# Patient Record
Sex: Female | Born: 1995 | Race: Black or African American | Hispanic: No | Marital: Single | Smoking: Current some day smoker
Health system: Southern US, Community
[De-identification: ages and names within clinical notes are randomized; demographics above are authoritative.]

## PROBLEM LIST (undated history)

## (undated) ENCOUNTER — Ambulatory Visit (HOSPITAL_COMMUNITY): Admission: EM | Payer: Self-pay | Source: Home / Self Care

## (undated) DIAGNOSIS — J302 Other seasonal allergic rhinitis: Secondary | ICD-10-CM

## (undated) DIAGNOSIS — J45909 Unspecified asthma, uncomplicated: Secondary | ICD-10-CM

---
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
## 2017-01-23 ENCOUNTER — Encounter (HOSPITAL_COMMUNITY): Payer: Self-pay | Admitting: Emergency Medicine

## 2017-01-23 ENCOUNTER — Other Ambulatory Visit: Payer: Self-pay

## 2017-01-23 ENCOUNTER — Emergency Department (HOSPITAL_COMMUNITY): Payer: Self-pay

## 2017-01-23 ENCOUNTER — Emergency Department (HOSPITAL_COMMUNITY)
Admission: EM | Admit: 2017-01-23 | Discharge: 2017-01-23 | Disposition: A | Discharge status: Home / Self Care | Payer: Self-pay | Attending: Emergency Medicine | Admitting: Emergency Medicine

## 2017-01-23 DIAGNOSIS — Z91013 Allergy to seafood: Secondary | ICD-10-CM | POA: Insufficient documentation

## 2017-01-23 DIAGNOSIS — Z720 Tobacco use: Secondary | ICD-10-CM

## 2017-01-23 DIAGNOSIS — Z79899 Other long term (current) drug therapy: Secondary | ICD-10-CM | POA: Insufficient documentation

## 2017-01-23 DIAGNOSIS — R05 Cough: Secondary | ICD-10-CM | POA: Insufficient documentation

## 2017-01-23 DIAGNOSIS — R0981 Nasal congestion: Secondary | ICD-10-CM | POA: Insufficient documentation

## 2017-01-23 DIAGNOSIS — R110 Nausea: Secondary | ICD-10-CM | POA: Insufficient documentation

## 2017-01-23 DIAGNOSIS — J069 Acute upper respiratory infection, unspecified: Secondary | ICD-10-CM | POA: Insufficient documentation

## 2017-01-23 DIAGNOSIS — R059 Cough, unspecified: Secondary | ICD-10-CM

## 2017-01-23 DIAGNOSIS — J45909 Unspecified asthma, uncomplicated: Secondary | ICD-10-CM | POA: Insufficient documentation

## 2017-01-23 DIAGNOSIS — F17200 Nicotine dependence, unspecified, uncomplicated: Secondary | ICD-10-CM | POA: Insufficient documentation

## 2017-01-23 DIAGNOSIS — R197 Diarrhea, unspecified: Secondary | ICD-10-CM | POA: Insufficient documentation

## 2017-01-23 MED ORDER — ALBUTEROL SULFATE HFA 108 (90 BASE) MCG/ACT IN AERS: 2.0000 | INHALATION_SPRAY | RESPIRATORY_TRACT | 0 refills | Status: DC | PRN

## 2017-01-23 MED ORDER — BENZONATATE 200 MG PO CAPS: 200.0000 mg | ORAL_CAPSULE | Freq: Three times a day (TID) | ORAL | 0 refills | Status: DC | PRN

## 2017-01-23 MED ORDER — ALBUTEROL SULFATE HFA 108 (90 BASE) MCG/ACT IN AERS
2.0000 | INHALATION_SPRAY | Freq: Once | RESPIRATORY_TRACT | Status: AC
  Administered 2017-01-23: 2 via RESPIRATORY_TRACT
  Filled 2017-01-23: qty 6.7

## 2017-01-23 NOTE — ED Provider Notes (Signed)
[facility]ENCY [facility] Provider Note   CSN: [id number] Arrival date & time: 01/23/17  1129     History   Chief Complaint Chief Complaint  Patient presents with  . Cough  . Nasal Congestion  . Diarrhea    HPI Paige Young is a 21 y.o. female with a PMHx of depression, asthma, and seasonal allergies, who presents to the ED with complaints of gradually improving cough times 2 weeks.  Patient states that she has had a cough with yellowish sputum production, has tried NyQuil with no relief, states that smoking cigarettes has worsened her cough.  She states that overall, over the last 2 weeks she has been gradually improving, however she keeps getting sent home from work because of her coughing so she came in for evaluation.  She has had associated rhinorrhea which has improved, intermittent nausea, and looser than normal stools approximately 2 times a day which are nonbloody and not watery.  She does admit to being a cigarette smoker.  She denies any sore throat, ear pain or drainage, wheezing, fevers, chills, CP, SOB, abd pain, vomiting, constipation, melena, hematochezia, hematuria, dysuria, myalgias, arthralgias, numbness, tingling, focal weakness, or any other complaints at this time. Denies recent travel, sick contacts, suspicious food intake, recent abx, frequent EtOH use, or NSAID use.    The history is provided by the patient and medical records. No language interpreter was used.  Cough  This is a new problem. The current episode started more than 1 week ago. The problem occurs constantly. The problem has been gradually improving. The cough is productive of sputum. There has been no fever. Associated symptoms include rhinorrhea. Pertinent negatives include no chest pain, no chills, no ear pain, no sore throat, no myalgias, no shortness of breath and no wheezing. She has tried cough syrup for the symptoms. The treatment provided no relief. She is a smoker.  Her past medical history is significant for asthma.  Diarrhea   Associated symptoms include cough. Pertinent negatives include no abdominal pain, no vomiting, no chills, no arthralgias and no myalgias.    Past Medical History:  Diagnosis Date  . Asthma   . Seasonal allergies     Patient Active Problem List   Diagnosis Date Noted  . Major depression 06/01/2013    History reviewed. No pertinent surgical history.  OB History    No data available       Home Medications    Prior to Admission medications   Medication Sig Start Date End Date Taking? Authorizing Provider  citalopram (CELEXA) 10 MG tablet Take 1 tablet (10 mg total) by mouth daily. 06/01/13   Susy FrizzleSheldon, Charles, MD    Family History No family history on file.  Social History Social History   Tobacco Use  . Smoking status: Current Every Day Smoker  . Smokeless tobacco: Current User  Substance Use Topics  . Alcohol use: Yes  . Drug use: Yes    Types: Marijuana     Allergies   Shellfish allergy   Review of Systems Review of Systems  Constitutional: Negative for chills and fever.  HENT: Positive for rhinorrhea. Negative for ear discharge, ear pain and sore throat.   Respiratory: Positive for cough. Negative for shortness of breath and wheezing.   Cardiovascular: Negative for chest pain.  Gastrointestinal: Positive for diarrhea and nausea. Negative for abdominal pain, blood in stool, constipation and vomiting.  Genitourinary: Negative for dysuria and hematuria.  Musculoskeletal: Negative for arthralgias and myalgias.  Skin: Negative for color change.  Allergic/Immunologic: Negative for immunocompromised state.  Neurological: Negative for weakness and numbness.  Psychiatric/Behavioral: Negative for confusion.   All other systems reviewed and are negative for acute change except as noted in the HPI.    Physical Exam Updated Vital Signs BP 119/67 (BP Location: Right Arm)   Pulse 87   Temp 98.2 F  (36.8 C) (Oral)   Resp 16   Ht 5\' 1"  (1.549 m)   Wt 46.3 kg (102 lb)   LMP 01/16/2017   SpO2 100%   BMI 19.27 kg/m   Physical Exam  Constitutional: She is oriented to person, place, and time. Vital signs are normal. She appears well-developed and well-nourished.  Non-toxic appearance. No distress.  Afebrile, nontoxic, NAD  HENT:  Head: Normocephalic and atraumatic.  Nose: Mucosal edema and rhinorrhea present.  Mouth/Throat: Uvula is midline, oropharynx is clear and moist and mucous membranes are normal. No trismus in the jaw. No uvula swelling. Tonsils are 0 on the right. Tonsils are 0 on the left. No tonsillar exudate.  Nose mildly congested with clear rhinorrhea. Oropharynx clear and moist, without uvular swelling or deviation, no trismus or drooling, no tonsillar swelling or erythema, no exudates.    Eyes: Conjunctivae and EOM are normal. Right eye exhibits no discharge. Left eye exhibits no discharge.  Neck: Normal range of motion. Neck supple.  Cardiovascular: Normal rate, regular rhythm, normal heart sounds and intact distal pulses. Exam reveals no gallop and no friction rub.  No murmur heard. Pulmonary/Chest: Effort normal. No respiratory distress. She has decreased breath sounds in the right lower field and the left lower field. She has no wheezes. She has no rhonchi. She has no rales.  Slightly diminished lung sounds in b/l lower fields, otherwise CTAB in all other lung fields, no w/r/r, no hypoxia or increased WOB, speaking in full sentences, SpO2 100% on RA   Abdominal: Soft. Normal appearance and bowel sounds are normal. She exhibits no distension. There is no tenderness. There is no rigidity, no rebound, no guarding, no CVA tenderness, no tenderness at McBurney's point and negative Murphy's sign.  Soft, NTND, +BS throughout, no r/g/r, neg murphy's, neg mcburney's, no CVA TTP   Musculoskeletal: Normal range of motion.  Neurological: She is alert and oriented to person, place,  and time. She has normal strength. No sensory deficit.  Skin: Skin is warm, dry and intact. No rash noted.  Psychiatric: She has a normal mood and affect.  Nursing note and vitals reviewed.    ED Treatments / Results  Labs (all labs ordered are listed, but only abnormal results are displayed) Labs Reviewed - No data to display  EKG  EKG Interpretation None       Radiology Dg Chest 2 View  Result Date: 01/23/2017 CLINICAL DATA:  Productive cough and nausea for 2 weeks. EXAM: CHEST  2 VIEW COMPARISON:  None. FINDINGS: Normal heart size and mediastinal contours. No acute infiltrate or edema. No effusion or pneumothorax. No acute osseous findings. IMPRESSION: Negative chest. Electronically Signed   By: Marnee Spring M.D.   On: 01/23/2017 16:44    Procedures Procedures (including critical care time)  Medications Ordered in ED Medications  albuterol (PROVENTIL HFA;VENTOLIN HFA) 108 (90 Base) MCG/ACT inhaler 2 puff (2 puffs Inhalation Given 01/23/17 1553)     Initial Impression / Assessment and Plan / ED Course  I have reviewed the triage vital signs and the nursing notes.  Pertinent labs & imaging results that  were available during my care of the patient were reviewed by me and considered in my medical decision making (see chart for details).     21 y.o. female here with gradually improving cough x2 wks, but keeps getting sent home because of coughing so she came here. Has had some intermittent nausea, and mild rhinorrhea. Also reports looser than normal stools 2x/day however no watery diarrhea or abdominal pain. On exam, mildly diminished lung sounds in lower fields bilaterally, no wheezing/rhonchi/rales, no hypoxia. No abdominal tenderness. Will get CXR to evaluate for PNA, if no PNA then this is likely a viral illness with persistent post-viral cough. Will give inhaler here and reassess shortly. Doubt need for labs, diarrhea likely just due to sputum production from  sputum/nasal secretions being swallowed.   4:53 PM CXR negative. Pt feeling better and lung sounds improved after inhaler. Symptoms likely from viral URI/lower respiratory infection, and since she's overall improving and CXR is negative for PNA, doubt need for abx at this time. Will send home with inhaler and tessalon perles, advised use of OTC remedies for symptomatic relief as well, smoking cessation strongly advised; advised OTC remedies and BRAT diet to help with diarrhea/loose stools. Discussed f/up with PCP in 1wk for recheck of symptoms. I explained the diagnosis and have given explicit precautions to return to the ER including for any other new or worsening symptoms. The patient understands and accepts the medical plan as it's been dictated and I have answered their questions. Discharge instructions concerning home care and prescriptions have been given. The patient is STABLE and is discharged to home in good condition.    Final Clinical Impressions(s) / ED Diagnoses   Final diagnoses:  Cough  Upper respiratory tract infection, unspecified type  Diarrhea, unspecified type  Tobacco user    ED Discharge Orders        Ordered    albuterol (PROVENTIL HFA;VENTOLIN HFA) 108 (90 Base) MCG/ACT inhaler  Every 4 hours PRN     01/23/17 1649    benzonatate (TESSALON) 200 MG capsule  3 times daily PRN     01/23/17 [address]1649       Sherell Christoffel, Howard LakeMercedes, [place] JerseyPA-C 01/23/17 1653    Cathren LaineSteinl, Kevin, MD 01/24/17 [phone]

## 2017-01-23 NOTE — Discharge Instructions (Signed)
Continue to stay well-hydrated. Gargle warm salt water and spit it out and Use chloraseptic spray as needed for sore throat. Continue to alternate between Tylenol and Ibuprofen for pain or fever. Use Mucinex for cough suppression/expectoration of mucus. Use netipot and flonase to help with nasal congestion. May consider over-the-counter Benadryl or other antihistamine to decrease secretions and for help with your symptoms. Use inhaler as directed, as needed for cough/chest congestion/wheezing/shortness of breath. Use Tesslon pearls for cough suppression. STOP SMOKING! Follow the BRAT diet (bananas-rice-applesauce-toast) as outlined below to help with diarrhea; may consider over the counter remedies such as pepto bismol or imodium as needed for diarrhea. Follow up with your primary care doctor in 5-7 days for recheck of ongoing symptoms. Return to emergency department for emergent changing or worsening of symptoms.

## 2017-01-23 NOTE — ED Triage Notes (Signed)
Pt. Stated, I've had a cough with stuff in there when I cough and also have had diarrhea. This started 2 weeks ago.

## 2017-01-23 NOTE — ED Notes (Signed)
Patient returned from X-ray 

## 2017-01-23 NOTE — ED Notes (Signed)
Patient transported to X-ray 

## 2021-01-28 ENCOUNTER — Ambulatory Visit: Payer: Self-pay | Admitting: *Deleted

## 2021-01-28 DIAGNOSIS — Z3400 Encounter for supervision of normal first pregnancy, unspecified trimester: Secondary | ICD-10-CM

## 2021-01-28 MED ORDER — VITAFOL-NANO 18-0.6-0.4 MG PO TABS: 1.0000 | ORAL_TABLET | Freq: Every day | ORAL | 11 refills | Status: AC

## 2021-01-28 NOTE — Progress Notes (Signed)
New OB Intake  I connected with  Paige Young on 01/28/21 at  2:00 PM EST by telephone Video Visit and verified that I am speaking with the correct person using two identifiers. Nurse is located at [facility] and pt is located at home.  I discussed the limitations, risks, security and privacy concerns of performing an evaluation and management service by telephone and the availability of in person appointments. I also discussed with the patient that there may be a patient responsible charge related to this service. The patient expressed understanding and agreed to proceed.  I explained I am completing New OB Intake today. We discussed her EDD of 07/11/21 that is based on [place] at [facility] on 01/06/21 @ 13.3 wks . Pt is G1/P0. I reviewed her allergies, medications, Medical/Surgical/OB history, and appropriate screenings. I informed her of [facility] services. Based on history, this is a/an  pregnancy uncomplicated .   Patient Active Problem List   Diagnosis Date Noted   Major depression 06/01/2013    Concerns addressed today  Delivery Plans:  Plans to deliver at [facility].   MyChart/Babyscripts MyChart access verified. I explained pt will have some visits in office and some virtually. Babyscripts instructions given and order placed. Patient verifies receipt of registration text/e-mail. Account successfully created and app downloaded.  Blood Pressure Cuff  Patient is self-pay; explained patient will be given BP cuff at first prenatal appt. Explained after first prenatal appt pt will check weekly and document in Babyscripts.  Weight scale: Patient    have weight scale. Weight scale ordered   Anatomy US Explained first scheduled [place] will be around 19 weeks. Anatomy US scheduled for 19 wks at M[facility]. Pt notified to arrive at [facility].  Labs Discussed Avelina Laine genetic screening with patient. Would like both Panorama and Horizon drawn at new OB visit. Routine prenatal labs needed.  Covid  Vaccine Patient has not covid vaccine.   Social Determinants of Health Food Insecurity: Patient denies food insecurity. WIC Referral: Patient is interested in referral to [facility].  Transportation: Patient denies transportation needs. Childcare: Discussed no children allowed at ultrasound appointments. Offered childcare services; patient declines childcare services at this time.  First visit review I reviewed new OB appt with pt. I explained she will have a pelvic exam, ob bloodwork with genetic screening, and PAP smear. Explained pt will be seen by Charlie Pitter at first visit; encounter routed to appropriate provider. Explained that patient will be seen by pregnancy navigator following visit with provider. [facility] information placed in AVS.   Harrel Lemon, RN 01/28/2021  2:17 PM

## 2021-01-29 ENCOUNTER — Telehealth: Payer: Self-pay | Admitting: *Deleted

## 2021-01-29 NOTE — Telephone Encounter (Signed)
TC to notify pt that RX PNV not covered by [facility] Unable to sent in PrePlus at this time due to backorder at pharmacies. Advised OTC PNV and to read ingredients to insure that shellfish allergen is not present.

## 2021-02-04 ENCOUNTER — Other Ambulatory Visit: Payer: Self-pay

## 2021-02-04 DIAGNOSIS — O0990 Supervision of high risk pregnancy, unspecified, unspecified trimester: Secondary | ICD-10-CM | POA: Insufficient documentation

## 2021-02-04 DIAGNOSIS — Z3490 Encounter for supervision of normal pregnancy, unspecified, unspecified trimester: Secondary | ICD-10-CM | POA: Insufficient documentation

## 2021-02-05 ENCOUNTER — Other Ambulatory Visit: Payer: Self-pay

## 2021-02-05 ENCOUNTER — Other Ambulatory Visit (HOSPITAL_COMMUNITY)
Admission: RE | Admit: 2021-02-05 | Discharge: 2021-02-05 | Disposition: A | Discharge status: Home / Self Care | Payer: Medicaid Other | Source: Ambulatory Visit | Attending: Women's Health | Admitting: Women's Health

## 2021-02-05 ENCOUNTER — Ambulatory Visit (INDEPENDENT_AMBULATORY_CARE_PROVIDER_SITE_OTHER): Payer: Self-pay | Admitting: Women's Health

## 2021-02-05 VITALS — BP 102/54 | HR 80 | Wt 120.0 lb

## 2021-02-05 DIAGNOSIS — Z3400 Encounter for supervision of normal first pregnancy, unspecified trimester: Secondary | ICD-10-CM

## 2021-02-05 DIAGNOSIS — Z3A17 17 weeks gestation of pregnancy: Secondary | ICD-10-CM

## 2021-02-05 DIAGNOSIS — O99342 Other mental disorders complicating pregnancy, second trimester: Secondary | ICD-10-CM

## 2021-02-05 DIAGNOSIS — F329 Major depressive disorder, single episode, unspecified: Secondary | ICD-10-CM

## 2021-02-05 DIAGNOSIS — Z8709 Personal history of other diseases of the respiratory system: Secondary | ICD-10-CM

## 2021-02-05 MED ORDER — ALBUTEROL SULFATE HFA 108 (90 BASE) MCG/ACT IN AERS
2.0000 | INHALATION_SPRAY | Freq: Four times a day (QID) | RESPIRATORY_TRACT | 2 refills | Status: AC | PRN
Start: 2021-02-05 — End: ?

## 2021-02-05 NOTE — Progress Notes (Signed)
History:   Paige Young is a 25 y.o. G1P0 at [redacted]w[redacted]d by 13w [place] at [facility] being seen today for her first obstetrical visit.  Her obstetrical history is significant for  history of asthma . Patient does intend to breast feed. Pregnancy history fully reviewed.  Allergies: Shellfish Current Medications: none PMH: asthma, no use of inhaler in years, hx of hospitalization for asthma as infant. No HTN, DM. PSH: none OB Hx: none Social Hx: pt does not smoke, drink, or use drugs. Family Hx: none Pt declines flu vaccine.  Patient reports no complaints.      HISTORY: OB History  Gravida Para Term Preterm AB Living  1 0 0 0 0 0  SAB IAB Ectopic Multiple Live Births  0 0 0 0 0    # Outcome Date GA Lbr Len/2nd Weight Sex Delivery Anes PTL Lv  1 Current             Last pap smear was done earlier this year and was normal, records requested.  Past Medical History:  Diagnosis Date   Asthma    Seasonal allergies    No past surgical history on file. Family History  Problem Relation Age of Onset   Asthma Mother    Hypertension Father    Social History   Tobacco Use   Smoking status: Never   Smokeless tobacco: Never  Vaping Use   Vaping Use: Never used  Substance Use Topics   Alcohol use: Not Currently   Drug use: Not Currently    Types: Marijuana   Allergies  Allergen Reactions   Shellfish Allergy Other (See Comments)   Current Outpatient Medications on File Prior to Visit  Medication Sig Dispense Refill   benzonatate (TESSALON) 200 MG capsule Take 1 capsule (200 mg total) 3 (three) times daily as needed by mouth for cough. 20 capsule 0   citalopram (CELEXA) 10 MG tablet Take 1 tablet (10 mg total) by mouth daily. 14 tablet 0   DM-Doxylamine-Acetaminophen (NYQUIL COLD & FLU PO) Take 1 tablet daily as needed by mouth (for common cold).     Prenatal-Fe Fum-Methf-FA w/o A (VITAFOL-NANO) 18-0.6-0.4 MG TABS Take 1 tablet by mouth daily. 30 tablet 11   No  current facility-administered medications on file prior to visit.    Review of Systems Pertinent items noted in HPI and remainder of comprehensive ROS otherwise negative.  Physical Exam:   Vitals:   02/05/21 1105  BP: (!) 102/54  Pulse: 80  Weight: 120 lb (54.4 kg)   Fetal Heart Rate (bpm): 160  Uterine size: 17w    General: well-developed, well-nourished female in no acute distress  Breasts:  pt declines  Skin: normal coloration and turgor, no rashes  Neurologic: oriented, normal, negative, normal mood  Extremities: normal strength, tone, and muscle mass, ROM of all joints is normal  HEENT PERRLA, extraocular movement intact and sclera clear, anicteric  Neck supple and no masses  Cardiovascular: regular rate and rhythm  Respiratory:  no respiratory distress, normal breath sounds  Abdomen: soft, non-tender; bowel sounds normal; no masses,  no organomegaly  Pelvic: deferred    Assessment:    Pregnancy: G1P0 Patient Active Problem List   Diagnosis Date Noted   History of asthma 02/05/2021   Supervision of normal pregnancy 02/04/2021   Major depression 06/01/2013     Plan:    1. Supervision of normal first pregnancy, antepartum - Cervicovaginal ancillary only( [place]) - Cytology - PAP( [facility]) - Culture, OB Urine - Obstetric Panel, Including HIV - Hepatitis C Antibody - Genetic Screening - AFP, Serum, Open Spina Bifida - Flu Vaccine QUAD 36+ mos IM (Fluarix, Quad PF) - [place] MFM OB COMP + 14 WK; Future  2. Major depressive disorder, remission status unspecified, unspecified whether recurrent - Ambulatory referral to Integrated Behavioral Health  PHQ9 SCORE ONLY 01/28/2021  PHQ-9 Total Score 12   GAD 7 : Generalized Anxiety Score 01/28/2021  Nervous, Anxious, on Edge 3  Control/stop worrying 1  Worry too much - different things 3  Trouble relaxing 1  Restless 0  Easily annoyed or irritable 2  Afraid - awful might happen 1  Total GAD 7 Score 11    3. [redacted] weeks gestation of pregnancy  4. History of asthma - no exacerbation in years - albuterol (VENTOLIN HFA) 108 (90 Base) MCG/ACT inhaler; Inhale 2 puffs into the lungs every 6 (six) hours as needed for wheezing or shortness of breath.  Dispense: 8 g; Refill: 2  Initial labs drawn. Continue prenatal vitamins. Problem list reviewed and updated. Genetic Screening discussed, NIPS: ordered. Ultrasound discussed; fetal anatomic survey: ordered. Anticipatory guidance about prenatal visits given including labs, ultrasounds, and testing.  Encouraged to complete MyChart Registration for her ability to review results, send requests, and have questions addressed.  The nature of [place] [facility] Practice with multiple MDs and Advanced Practice Providers was explained to patient; also emphasized that residents, students are part of our team. Routine obstetric precautions reviewed. Encouraged to seek out care at office or emergency room [facility] preferred) for urgent and/or emergent concerns. Return in about 4 weeks (around 03/05/2021) for in-person LOB/APP OK.     Marylen Ponto, NP  11:32 AM 02/05/2021

## 2021-02-05 NOTE — Patient Instructions (Addendum)
Maternity Assessment Unit (MAU)  The Maternity Assessment Unit (MAU) is located at the [facility] at [facility]. The address is: [address] [place], [place], [place] [address]. Please see map below for additional directions.    The Maternity Assessment Unit is designed to help you during your pregnancy, and for up to 6 weeks after delivery, with any pregnancy- or postpartum-related emergencies, if you think you are in labor, or if your water has broken. For example, if you experience nausea and vomiting, vaginal bleeding, severe abdominal or pelvic pain, elevated blood pressure or other problems related to your pregnancy or postpartum time, please come to the Maternity Assessment Unit for assistance.       Second Trimester of Pregnancy The second trimester of pregnancy is from week 13 through week 27. This is months 4 through 6 of pregnancy. The second trimester is often a time when you feel your best. Your body has adjusted to being pregnant, and you begin to feel better physically. During the second trimester: Morning sickness has lessened or stopped completely. You may have more energy. You may have an increase in appetite. The second trimester is also a time when the unborn baby (fetus) is growing rapidly. At the end of the sixth month, the fetus may be up to 12 inches long and weigh about 1 pounds. You will likely begin to feel the baby move (quickening) between 16 and 20 weeks of pregnancy. Body changes during your second trimester Your body continues to go through many changes during your second trimester. The changes vary and generally return to normal after the baby is born. Physical changes Your weight will continue to increase. You will notice your lower abdomen bulging out. You may begin to get stretch marks on your hips, abdomen, and breasts. Your breasts will continue to grow and to become tender. Dark spots or blotches (chloasma or  mask of pregnancy) may develop on your face. A dark line from your belly button to the pubic area (linea nigra) may appear. You may have changes in your hair. These can include thickening of your hair, rapid growth, and changes in texture. Some people also have hair loss during or after pregnancy, or hair that feels dry or thin. Health changes You may develop headaches. You may have heartburn. You may develop constipation. You may develop hemorrhoids or swollen, bulging veins (varicose veins). Your gums may bleed and may be sensitive to brushing and flossing. You may urinate more often because the fetus is pressing on your bladder. You may have back pain. This is caused by: Weight gain. Pregnancy hormones that are relaxing the joints in your pelvis. A shift in weight and the muscles that support your balance. Follow these instructions at home: Medicines Follow your health care provider's instructions regarding medicine use. Specific medicines may be either safe or unsafe to take during pregnancy. Do not take any medicines unless approved by your health care provider. Take a prenatal vitamin that contains at least 600 micrograms (mcg) of folic acid. Eating and drinking Eat a healthy diet that includes fresh fruits and vegetables, whole grains, good sources of protein such as meat, eggs, or tofu, and low-fat dairy products. Avoid raw meat and unpasteurized juice, milk, and cheese. These carry germs that can harm you and your baby. You may need to take these actions to prevent or treat constipation: Drink enough fluid to keep your urine pale yellow. Eat foods that are high in fiber, such  as beans, whole grains, and fresh fruits and vegetables. Limit foods that are high in fat and processed sugars, such as fried or sweet foods. Activity Exercise only as directed by your health care provider. Most people can continue their usual exercise routine during pregnancy. Try to exercise for 30 minutes  at least 5 days a week. Stop exercising if you develop contractions in your uterus. Stop exercising if you develop pain or cramping in the lower abdomen or lower back. Avoid exercising if it is very hot or humid or if you are at a high altitude. Avoid heavy lifting. If you choose to, you may have sex unless your health care provider tells you not to. Relieving pain and discomfort Wear a supportive bra to prevent discomfort from breast tenderness. Take warm sitz baths to soothe any pain or discomfort caused by hemorrhoids. Use hemorrhoid cream if your health care provider approves. Rest with your legs raised (elevated) if you have leg cramps or low back pain. If you develop varicose veins: Wear support hose as told by your health care provider. Elevate your feet for 15 minutes, 3-4 times a day. Limit salt in your diet. Safety Wear your seat belt at all times when driving or riding in a car. Talk with your health care provider if someone is verbally or physically abusive to you. Lifestyle Do not use hot tubs, steam rooms, or saunas. Do not douche. Do not use tampons or scented sanitary pads. Avoid cat litter boxes and soil used by cats. These carry germs that can cause birth defects in the baby and possibly loss of the fetus by miscarriage or stillbirth. Do not use herbal remedies, alcohol, illegal drugs, or medicines that are not approved by your health care provider. Chemicals in these products can harm your baby. Do not use any products that contain nicotine or tobacco, such as cigarettes, e-cigarettes, and chewing tobacco. If you need help quitting, ask your health care provider. General instructions During a routine prenatal visit, your health care provider will do a physical exam and other tests. He or she will also discuss your overall health. Keep all follow-up visits. This is important. Ask your health care provider for a referral to a local prenatal education class. Ask for help if  you have counseling or nutritional needs during pregnancy. Your health care provider can offer advice or refer you to specialists for help with various needs. Where to find more information American Pregnancy Association: americanpregnancy.org [employer]: https://www.todd-brady.net/ Office on [employer] Health: MightyReward.co.nz Contact a health care provider if you have: A headache that does not go away when you take medicine. Vision changes or you see spots in front of your eyes. Mild pelvic cramps, pelvic pressure, or nagging pain in the abdominal area. Persistent nausea, vomiting, or diarrhea. A bad-smelling vaginal discharge or foul-smelling urine. Pain when you urinate. Sudden or extreme swelling of your face, hands, ankles, feet, or legs. A fever. Get help right away if you: Have fluid leaking from your vagina. Have spotting or bleeding from your vagina. Have severe abdominal cramping or pain. Have difficulty breathing. Have chest pain. Have fainting spells. Have not felt your baby move for the time period told by your health care provider. Have new or increased pain, swelling, or redness in an arm or leg. Summary The second trimester of pregnancy is from week 13 through week 27 (months 4 through 6). Do not use herbal remedies, alcohol, illegal drugs, or medicines that are not  approved by your health care provider. Chemicals in these products can harm your baby. Exercise only as directed by your health care provider. Most people can continue their usual exercise routine during pregnancy. Keep all follow-up visits. This is important. This information is not intended to replace advice given to you by your health care provider. Make sure you discuss any questions you have with your health care provider. Document Revised: 08/02/2019 Document Reviewed: 06/08/2019 Elsevier Patient Education  2022 Elsevier Inc.       Round  Ligament Pain The round ligaments are a pair of cord-like tissues that help support the uterus. They can become a source of pain during pregnancy as the ligaments soften and stretch as the baby grows. The pain usually begins in the second trimester (13-28 weeks) of pregnancy, and should only last for a few seconds when it occurs. However, the pain can come and go until the baby is delivered. The pain does not cause harm to the baby. Round ligament pain is usually a short, sharp, and pinching pain, but it can also be a dull, lingering, and aching pain. The pain is felt in the lower side of the abdomen or in the groin. It usually starts deep in the groin and moves up to the outside of the hip area. The pain may happen when you: Suddenly change position, such as quickly going from a sitting to standing position. Do physical activity. Cough or sneeze. Follow these instructions at home: Managing pain  When the pain starts, relax. Then, try any of these methods to help with the pain: Sit down. Flex your knees up to your abdomen. Lie on your side with one pillow under your abdomen and another pillow between your legs. Sit in a warm bath for 15-20 minutes or until the pain goes away. General instructions Watch your condition for any changes. Move slowly when you sit down or stand up. Stop or reduce your physical activities if they cause pain. Avoid long walks if they cause pain. Take over-the-counter and prescription medicines only as told by your health care provider. Keep all follow-up visits. This is important. Contact a health care provider if: Your pain does not go away with treatment. You feel pain in your back that you did not have before. Your medicine is not helping. You have a fever or chills. You have nausea or vomiting. You have diarrhea. You have pain when you urinate. Get help right away if: You have pain that is a rhythmic, cramping pain similar to labor pains. Labor pains are  usually 2 minutes apart, last for about 1 minute, and involve a bearing down feeling or pressure in your pelvis. You have vaginal bleeding. These symptoms may represent a serious problem that is an emergency. Do not wait to see if the symptoms will go away. Get medical help right away. Call your local emergency services (911 in the [place] Do not drive yourself to the hospital. Summary Round ligament pain is felt in the lower abdomen or groin. This pain usually begins in the second trimester (13-28 weeks) and should only last for a few seconds when it occurs. You may notice the pain when you suddenly change position, when you cough or sneeze, or during physical activity. Relaxing, flexing your knees to your abdomen, lying on one side, or taking a warm bath may help to get rid of the pain. Contact your health care provider if the pain does not go away. This information is not intended to replace  advice given to you by your health care provider. Make sure you discuss any questions you have with your health care provider. Document Revised: 05/08/2020 Document Reviewed: 05/08/2020 Elsevier Patient Education  2022 Elsevier Inc.       Preterm Labor The normal length of a pregnancy is 39-41 weeks. Preterm labor is when labor starts before 37 completed weeks of pregnancy. Babies who are born prematurely and survive may not be fully developed and may be at an increased risk for long-term problems such as cerebral palsy, developmental delays, and vision and hearing problems. Babies who are born too early may have problems soon after birth. Premature babies may have problems regulating blood sugar, body temperature, heart rate, and breathing rate. These babies often have trouble with feeding. The risk of having problems is highest for babies who are born before 34 weeks of pregnancy. What are the causes? The exact cause of this condition is not known. What increases the risk? You are more likely to have  preterm labor if you have certain risk factors that relate to your medical history, problems with present and past pregnancies, and lifestyle factors. Medical history You have abnormalities of the uterus, including a short cervix. You have STIs (sexually transmitted infections) or other infections of the urinary tract and the vagina. You have chronic illnesses, such as blood clotting problems, diabetes, or high blood pressure. You are overweight or underweight. Present and past pregnancies You have had preterm labor before. You are pregnant with twins or other multiples. You have been diagnosed with a condition in which the placenta covers your cervix (placenta previa). You waited less than 18 months between giving birth and becoming pregnant again. Your unborn baby has some abnormalities. You have vaginal bleeding during pregnancy. You became pregnant through in vitro fertilization (IVF). Lifestyle and environmental factors You use tobacco products or drink alcohol. You use drugs. You have stress and no social support. You experience domestic violence. You are exposed to certain chemicals or environmental pollutants. Other factors You are younger than age 61 or older than age 72. What are the signs or symptoms? Symptoms of this condition include: Cramps similar to those that can happen during a menstrual period. The cramps may happen with diarrhea. Pain in the abdomen or lower back. Regular contractions that may feel like tightening of the abdomen. A feeling of increased pressure in the pelvis. Increased watery or bloody mucus discharge from the vagina. Water breaking (ruptured amniotic sac). How is this diagnosed? This condition is diagnosed based on: Your medical history and a physical exam. A pelvic exam. An ultrasound. Monitoring your uterus for contractions. Other tests, including: A swab of the cervix to check for a chemical called fetal fibronectin. Urine tests. How is  this treated? Treatment for this condition depends on the length of your pregnancy, your condition, and the health of your baby. Treatment may include: Taking medicines, such as: Hormone medicines. These may be given early in pregnancy to help support the pregnancy. Medicines to stop contractions. Medicines to help mature the baby's lungs. These may be prescribed if the risk of delivery is high. Medicines to help protect your baby from brain and nerve complications such as cerebral palsy. Bed rest. If the labor happens before 34 weeks of pregnancy, you may need to stay in the hospital. Delivery of the baby. Follow these instructions at home:  Do not use any products that contain nicotine or tobacco. These products include cigarettes, chewing tobacco, and vaping devices, such as  e-cigarettes. If you need help quitting, ask your health care provider. Do not drink alcohol. Take over-the-counter and prescription medicines only as told by your health care provider. Rest as told by your health care provider. Return to your normal activities as told by your health care provider. Ask your health care provider what activities are safe for you. Keep all follow-up visits. This is important. How is this prevented? To increase your chance of having a full-term pregnancy: Do not use drugs or take medicines that have not been prescribed to you during your pregnancy. Talk with your health care provider before taking any herbal supplements, even if you have been taking them regularly. Make sure you gain a healthy amount of weight during your pregnancy. Watch for infection. If you think that you might have an infection, get it checked right away. Symptoms of infection may include: Fever. Abnormal vaginal discharge or discharge that smells bad. Pain or burning with urination. Needing to urinate urgently. Frequently urinating or passing small amounts of urine frequently. Blood in your urine or urine that  smells bad or unusual. Where to find more information [place].S. Department of Health and Cytogeneticist on Women's Health: http://hoffman.com/ [employer]nec[employer]: www.acog.org Centers for Disease Control and Prevention, Preterm Birth: FootballExhibition.com.br Contact a health care provider if: You think you are going into preterm labor. You have signs or symptoms of preterm labor. You have symptoms of infection. Get help right away if: You are having regular, painful contractions every 5 minutes or less. Your water breaks. Summary Preterm labor is labor that starts before you reach 37 weeks of pregnancy. Delivering your baby early increases your baby's risk of developing long-term problems. You are more likely to have preterm labor if you have certain risk factors that relate to your medical history, problems with present and past pregnancies, and lifestyle factors. Keep all follow-up visits. This is important. Contact a health care provider if you have signs or symptoms of preterm labor. This information is not intended to replace advice given to you by your health care provider. Make sure you discuss any questions you have with your health care provider. Document Revised: 02/27/2020 Document Reviewed: 02/27/2020 Elsevier Patient Education  2022 [employer].

## 2021-02-06 LAB — CERVICOVAGINAL ANCILLARY ONLY
Chlamydia: NEGATIVE
Comment: NEGATIVE
Comment: NORMAL
Neisseria Gonorrhea: NEGATIVE

## 2021-02-07 ENCOUNTER — Encounter: Payer: Medicaid Other | Admitting: Licensed Clinical Social Worker

## 2021-02-07 ENCOUNTER — Telehealth: Payer: Self-pay | Admitting: Licensed Clinical Social Worker

## 2021-02-07 LAB — OBSTETRIC PANEL, INCLUDING HIV
Antibody Screen: NEGATIVE
Basophils Absolute: 0 10*3/uL (ref 0.0–0.2)
Basos: 0 %
EOS (ABSOLUTE): 0.3 10*3/uL (ref 0.0–0.4)
Eos: 3 %
HIV Screen 4th Generation wRfx: NONREACTIVE
Hematocrit: 31.7 % — ABNORMAL LOW (ref 34.0–46.6)
Hemoglobin: 11 g/dL — ABNORMAL LOW (ref 11.1–15.9)
Hepatitis B Surface Ag: NEGATIVE
Immature Grans (Abs): 0 10*3/uL (ref 0.0–0.1)
Immature Granulocytes: 0 %
Lymphocytes Absolute: 1.6 10*3/uL (ref 0.7–3.1)
Lymphs: 18 %
MCH: 32.1 pg (ref 26.6–33.0)
MCHC: 34.7 g/dL (ref 31.5–35.7)
MCV: 92 fL (ref 79–97)
Monocytes Absolute: 0.8 10*3/uL (ref 0.1–0.9)
Monocytes: 9 %
Neutrophils Absolute: 6 10*3/uL (ref 1.4–7.0)
Neutrophils: 70 %
Platelets: 263 10*3/uL (ref 150–450)
RBC: 3.43 x10E6/uL — ABNORMAL LOW (ref 3.77–5.28)
RDW: 11.8 % (ref 11.7–15.4)
RPR Ser Ql: NONREACTIVE
Rh Factor: POSITIVE
Rubella Antibodies, IGG: 5.3 index (ref >0.99)
WBC: 8.7 10*3/uL (ref 3.4–10.8)

## 2021-02-07 LAB — CULTURE, OB URINE

## 2021-02-07 LAB — AFP, SERUM, OPEN SPINA BIFIDA
AFP MoM: 0.84
AFP Value: 46 ng/mL
Gest. Age on Collection Date: 17.5 weeks
Maternal Age At EDD: 25.2 yr
OSBR Risk 1 IN: 10000
Test Results:: NEGATIVE
Weight: 120 [lb_av]

## 2021-02-07 LAB — URINE CULTURE, OB REFLEX

## 2021-02-07 LAB — HEPATITIS C ANTIBODY: Hep C Virus Ab: 0.1 s/co ratio (ref 0.0–0.9)

## 2021-02-07 NOTE — Telephone Encounter (Signed)
Called pt regarding scheduled mychart visit. Left message requesting callback  

## 2021-02-11 ENCOUNTER — Encounter: Payer: Self-pay | Admitting: Women's Health

## 2021-02-17 ENCOUNTER — Encounter: Payer: Self-pay | Admitting: Women's Health

## 2021-02-18 ENCOUNTER — Ambulatory Visit: Payer: Medicaid Other | Attending: Obstetrics & Gynecology

## 2021-02-18 ENCOUNTER — Other Ambulatory Visit: Payer: Self-pay

## 2021-02-18 ENCOUNTER — Ambulatory Visit: Payer: Medicaid Other | Admitting: *Deleted

## 2021-02-18 ENCOUNTER — Encounter: Payer: Self-pay | Admitting: *Deleted

## 2021-02-18 VITALS — BP 97/58 | HR 79

## 2021-02-18 DIAGNOSIS — Z3689 Encounter for other specified antenatal screening: Secondary | ICD-10-CM

## 2021-02-18 DIAGNOSIS — Z3A19 19 weeks gestation of pregnancy: Secondary | ICD-10-CM

## 2021-02-18 DIAGNOSIS — O99513 Diseases of the respiratory system complicating pregnancy, third trimester: Secondary | ICD-10-CM

## 2021-02-18 DIAGNOSIS — Z3400 Encounter for supervision of normal first pregnancy, unspecified trimester: Secondary | ICD-10-CM

## 2021-02-18 DIAGNOSIS — J45909 Unspecified asthma, uncomplicated: Secondary | ICD-10-CM

## 2021-02-18 DIAGNOSIS — Z363 Encounter for antenatal screening for malformations: Secondary | ICD-10-CM | POA: Insufficient documentation

## 2021-02-19 ENCOUNTER — Other Ambulatory Visit: Payer: Self-pay | Admitting: *Deleted

## 2021-02-19 DIAGNOSIS — Z3689 Encounter for other specified antenatal screening: Secondary | ICD-10-CM

## 2021-02-28 ENCOUNTER — Encounter: Payer: Medicaid Other | Admitting: Licensed Clinical Social Worker

## 2021-02-28 ENCOUNTER — Telehealth: Payer: Self-pay | Admitting: Licensed Clinical Social Worker

## 2021-02-28 NOTE — Telephone Encounter (Signed)
Called pt regarding scheduled mychart visit. Unable to leave voicemail due to error message from pt phone. Sent mychart link via text message. Pt no show visit.

## 2021-03-05 ENCOUNTER — Encounter: Payer: Medicaid Other | Admitting: Nurse Practitioner

## 2021-03-11 ENCOUNTER — Encounter: Payer: Self-pay | Admitting: *Deleted

## 2021-03-13 ENCOUNTER — Ambulatory Visit (HOSPITAL_COMMUNITY)
Admission: RE | Admit: 2021-03-13 | Discharge: 2021-03-13 | Disposition: A | Discharge status: Home / Self Care | Payer: Self-pay | Attending: Psychiatry | Admitting: Psychiatry

## 2021-03-13 ENCOUNTER — Ambulatory Visit (HOSPITAL_COMMUNITY): Admission: RE | Admit: 2021-03-13 | Payer: Self-pay | Source: Home / Self Care | Admitting: Psychiatry
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
## 2021-03-13 NOTE — Discharge Instructions (Signed)
Follow up with PHP program referral has been made.
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
## 2021-03-18 ENCOUNTER — Ambulatory Visit: Payer: Medicaid Other

## 2021-03-18 ENCOUNTER — Other Ambulatory Visit: Payer: Self-pay

## 2021-03-18 ENCOUNTER — Ambulatory Visit (INDEPENDENT_AMBULATORY_CARE_PROVIDER_SITE_OTHER): Payer: Self-pay | Admitting: Obstetrics and Gynecology

## 2021-03-18 ENCOUNTER — Telehealth (HOSPITAL_COMMUNITY): Payer: Self-pay | Admitting: Professional

## 2021-03-18 VITALS — BP 94/54 | HR 86 | Wt 128.6 lb

## 2021-03-18 DIAGNOSIS — Z3402 Encounter for supervision of normal first pregnancy, second trimester: Secondary | ICD-10-CM | POA: Diagnosis not present

## 2021-03-18 DIAGNOSIS — Z8709 Personal history of other diseases of the respiratory system: Secondary | ICD-10-CM

## 2021-03-18 DIAGNOSIS — Z3400 Encounter for supervision of normal first pregnancy, unspecified trimester: Secondary | ICD-10-CM

## 2021-03-18 DIAGNOSIS — Z3A23 23 weeks gestation of pregnancy: Secondary | ICD-10-CM | POA: Diagnosis not present

## 2021-03-18 DIAGNOSIS — F329 Major depressive disorder, single episode, unspecified: Secondary | ICD-10-CM

## 2021-03-18 NOTE — Progress Notes (Signed)
° °  PRENATAL VISIT NOTE  Subjective:  Paige Young is a 26 y.o. G1P0 at [redacted]w[redacted]d being seen today for ongoing prenatal care.  She is currently monitored for the following issues for this low-risk pregnancy and has Major depression; Supervision of normal pregnancy; and History of asthma on their problem list.  Patient reports no complaints.  Contractions: Not present. Vag. Bleeding: None.  Movement: Present. Denies leaking of fluid.   The following portions of the patient's history were reviewed and updated as appropriate: allergies, current medications, past family history, past medical history, past social history, past surgical history and problem list.   Objective:   Vitals:   03/18/21 1345  BP: (!) 94/54  Pulse: 86  Weight: 128 lb 9.6 oz (58.3 kg)    Fetal Status: Fetal Heart Rate (bpm): 155   Movement: Present     General:  Alert, oriented and cooperative. Patient is in no acute distress.  Skin: Skin is warm and dry. No rash noted.   Cardiovascular: Normal heart rate noted  Respiratory: Normal respiratory effort, no problems with respiration noted  Abdomen: Soft, gravid, appropriate for gestational age.  Pain/Pressure: Present     Pelvic: Cervical exam deferred        Extremities: Normal range of motion.  Edema: None  Mental Status: Normal mood and affect. Normal behavior. Normal judgment and thought content.   Assessment and Plan:  Pregnancy: G1P0 at [redacted]w[redacted]d 1. Supervision of normal first pregnancy, antepartum 28w labs next time Growth was 9%ile on anatomy scan - will schedule for this week or next for her interval growth.   2. Major depressive disorder, remission status unspecified, unspecified whether recurrent No meds. Mood is anxious due to the growth on anatomy. Counseling provided. Pt felt reassured.   3. History of asthma Albuterol prn, no exacerbations  Preterm labor symptoms and general obstetric precautions including but not limited to vaginal bleeding,  contractions, leaking of fluid and fetal movement were reviewed in detail with the patient. Please refer to After Visit Summary for other counseling recommendations.   Return in about 4 weeks (around 04/15/2021) for OB VISIT, MD or APP, 2 hr GTT.  No future appointments.   Milas Hock, MD

## 2021-03-20 ENCOUNTER — Ambulatory Visit: Payer: Medicaid Other | Attending: Obstetrics

## 2021-03-20 ENCOUNTER — Other Ambulatory Visit: Payer: Self-pay | Admitting: *Deleted

## 2021-03-20 ENCOUNTER — Other Ambulatory Visit: Payer: Self-pay

## 2021-03-20 ENCOUNTER — Ambulatory Visit: Payer: Medicaid Other | Admitting: *Deleted

## 2021-03-20 ENCOUNTER — Other Ambulatory Visit: Payer: Self-pay | Admitting: Obstetrics

## 2021-03-20 VITALS — BP 106/54 | HR 91

## 2021-03-20 DIAGNOSIS — O365921 Maternal care for other known or suspected poor fetal growth, second trimester, fetus 1: Secondary | ICD-10-CM

## 2021-03-20 DIAGNOSIS — J45909 Unspecified asthma, uncomplicated: Secondary | ICD-10-CM | POA: Diagnosis not present

## 2021-03-20 DIAGNOSIS — Z3402 Encounter for supervision of normal first pregnancy, second trimester: Secondary | ICD-10-CM

## 2021-03-20 DIAGNOSIS — Z3A23 23 weeks gestation of pregnancy: Secondary | ICD-10-CM | POA: Diagnosis not present

## 2021-03-20 DIAGNOSIS — O99512 Diseases of the respiratory system complicating pregnancy, second trimester: Secondary | ICD-10-CM | POA: Diagnosis not present

## 2021-03-20 DIAGNOSIS — Z3689 Encounter for other specified antenatal screening: Secondary | ICD-10-CM

## 2021-03-20 DIAGNOSIS — O365930 Maternal care for other known or suspected poor fetal growth, third trimester, not applicable or unspecified: Secondary | ICD-10-CM

## 2021-03-21 ENCOUNTER — Telehealth (HOSPITAL_COMMUNITY): Payer: Self-pay | Admitting: Professional

## 2021-04-15 ENCOUNTER — Encounter: Payer: Self-pay | Admitting: Obstetrics

## 2021-04-15 ENCOUNTER — Other Ambulatory Visit: Payer: Medicaid Other

## 2021-04-15 ENCOUNTER — Ambulatory Visit (INDEPENDENT_AMBULATORY_CARE_PROVIDER_SITE_OTHER): Payer: Medicaid Other | Admitting: Obstetrics

## 2021-04-15 ENCOUNTER — Other Ambulatory Visit: Payer: Self-pay

## 2021-04-15 VITALS — BP 110/61 | HR 77 | Wt 132.0 lb

## 2021-04-15 DIAGNOSIS — F439 Reaction to severe stress, unspecified: Secondary | ICD-10-CM

## 2021-04-15 DIAGNOSIS — Z3400 Encounter for supervision of normal first pregnancy, unspecified trimester: Secondary | ICD-10-CM

## 2021-04-15 NOTE — Progress Notes (Signed)
Subjective:  Paige Young is a 26 y.o. G1P0 at [redacted]w[redacted]d being seen today for ongoing prenatal care.  She is currently monitored for the following issues for this low-risk pregnancy and has Major depression; Supervision of normal pregnancy; and History of asthma on their problem list.  Patient reports backache.  Contractions: Not present. Vag. Bleeding: None.  Movement: Present. Denies leaking of fluid.   The following portions of the patient's history were reviewed and updated as appropriate: allergies, current medications, past family history, past medical history, past social history, past surgical history and problem list. Problem list updated.  Objective:   Vitals:   04/15/21 0903  BP: 110/61  Pulse: 77  Weight: 132 lb (59.9 kg)    Fetal Status: Fetal Heart Rate (bpm): 165   Movement: Present     General:  Alert, oriented and cooperative. Patient is in no acute distress.  Skin: Skin is warm and dry. No rash noted.   Cardiovascular: Normal heart rate noted  Respiratory: Normal respiratory effort, no problems with respiration noted  Abdomen: Soft, gravid, appropriate for gestational age. Pain/Pressure: Present     Pelvic:  Cervical exam deferred        Extremities: Normal range of motion.  Edema: None  Mental Status: Normal mood and affect. Normal behavior. Normal judgment and thought content.   Urinalysis:      Assessment and Plan:  Pregnancy: G1P0 at [redacted]w[redacted]d  1. Supervision of normal first pregnancy, antepartum  2. Situational stress Rx: - Ambulatory referral to [facility]     Preterm labor symptoms and general obstetric precautions including but not limited to vaginal bleeding, contractions, leaking of fluid and fetal movement were reviewed in detail with the patient. Please refer to After Visit Summary for other counseling recommendations.   Return in about 2 weeks (around 04/29/2021) for ROB, 2 hour OGTT.   Brock Bad, MD  04/15/21

## 2021-04-15 NOTE — Progress Notes (Signed)
Patient presents for ROB. PHQ9: 8, GAD7: 14. Referral for [facility].Patient ate cereal before appointment today, prefers lab draw at next appointment. Defers TDAP to next appointment. No other concerns today. Sharlyne Pacas, RN

## 2021-04-17 ENCOUNTER — Ambulatory Visit: Payer: Medicaid Other | Admitting: *Deleted

## 2021-04-17 ENCOUNTER — Other Ambulatory Visit: Payer: Self-pay | Admitting: Obstetrics

## 2021-04-17 ENCOUNTER — Ambulatory Visit: Payer: Medicaid Other | Attending: Obstetrics and Gynecology | Admitting: Obstetrics and Gynecology

## 2021-04-17 ENCOUNTER — Other Ambulatory Visit: Payer: Self-pay

## 2021-04-17 ENCOUNTER — Other Ambulatory Visit: Payer: Self-pay | Admitting: *Deleted

## 2021-04-17 ENCOUNTER — Ambulatory Visit: Payer: Medicaid Other | Attending: Obstetrics

## 2021-04-17 VITALS — BP 107/50 | HR 70

## 2021-04-17 DIAGNOSIS — O99512 Diseases of the respiratory system complicating pregnancy, second trimester: Secondary | ICD-10-CM | POA: Diagnosis not present

## 2021-04-17 DIAGNOSIS — Z3A27 27 weeks gestation of pregnancy: Secondary | ICD-10-CM

## 2021-04-17 DIAGNOSIS — O365921 Maternal care for other known or suspected poor fetal growth, second trimester, fetus 1: Secondary | ICD-10-CM

## 2021-04-17 DIAGNOSIS — Z3400 Encounter for supervision of normal first pregnancy, unspecified trimester: Secondary | ICD-10-CM

## 2021-04-17 DIAGNOSIS — J45909 Unspecified asthma, uncomplicated: Secondary | ICD-10-CM

## 2021-04-17 DIAGNOSIS — O365920 Maternal care for other known or suspected poor fetal growth, second trimester, not applicable or unspecified: Secondary | ICD-10-CM | POA: Diagnosis present

## 2021-04-17 NOTE — Progress Notes (Signed)
Maternal-Fetal Medicine   Name: Paige Young DOB: 06-Aug-1995 MRN: [id number] Referring Provider: Coral Ceo, MD  I had the [facility] of [facility]. G1 P0 at 27w 6d gestation.  She is here for fetal growth assessment. On previous growth assessment, the estimated fetal weight was at the 12 percentile.  Her prepregnancy BMI is 20.8. On cell-free fetal DNA screening, the risks of fetal aneuploidies are not increased.  MSAFP screening showed low risk for open neural tube defects. Patient will be undergoing screening for gestational diabetes next week. Blood pressure today at her office is 107/50 mmHg.  Ultrasound On today's ultrasound, the estimated fetal weight is at the 7th percentile and the abdominal circumference measurement is at the 9th percentile.  Amniotic fluid is normal and good fetal activity seen.  Umbilical artery Doppler showed normal forward diastolic flow.  Early-onset fetal growth restriction I explained the finding of fetal growth restriction that is difficult to differentiate from a constitutionally small for gestational age fetus. I discussed the possible causes of fetal growth restriction including placental insufficiency (most common cause), chromosomal anomalies and fetal infections.  Patient did not give history of fever or rashes. I explained that only amniocentesis will give a definitive result on the fetal karyotype and some genetic conditions Doctor, general practice).  I explained amniocentesis procedure and possible complication of preterm delivery (1 and 500 procedures). I discussed ultrasound protocol of monitoring fetal growth restriction. Timing of delivery: Since small for gestational age fetuses have a higher chance of having perinatal mortality and morbidity, delivery at 34 to [redacted] weeks gestation is reasonable.  If, however, severe fetal growth restriction is seen with normal antenatal testing, we will recommend delivery  at [redacted] weeks gestation.  Recommendations -An appointment was made for her to return in 2 weeks for umbilical artery Doppler study. -Fetal growth assessment in 3 weeks.  Thank you for consultation.  If you have any questions or concerns, please contact me the [facility].  Consultation including face-to-face (more than 50%) counseling 30 minutes.

## 2021-05-01 ENCOUNTER — Other Ambulatory Visit: Payer: Self-pay

## 2021-05-01 ENCOUNTER — Other Ambulatory Visit: Payer: Medicaid Other

## 2021-05-01 ENCOUNTER — Ambulatory Visit (INDEPENDENT_AMBULATORY_CARE_PROVIDER_SITE_OTHER): Payer: Medicaid Other | Admitting: Obstetrics

## 2021-05-01 ENCOUNTER — Ambulatory Visit: Payer: Medicaid Other

## 2021-05-01 ENCOUNTER — Encounter: Payer: Self-pay | Admitting: Obstetrics

## 2021-05-01 ENCOUNTER — Encounter: Payer: Medicaid Other | Admitting: Licensed Clinical Social Worker

## 2021-05-01 VITALS — BP 110/58 | HR 72 | Wt 132.0 lb

## 2021-05-01 DIAGNOSIS — O0990 Supervision of high risk pregnancy, unspecified, unspecified trimester: Secondary | ICD-10-CM

## 2021-05-01 DIAGNOSIS — O365930 Maternal care for other known or suspected poor fetal growth, third trimester, not applicable or unspecified: Secondary | ICD-10-CM

## 2021-05-01 NOTE — Progress Notes (Signed)
Pt in office for ROB visit. She has does not have any concerns today.

## 2021-05-01 NOTE — Progress Notes (Signed)
Subjective:  Paige Young is a 26 y.o. G1P0 at [redacted]w[redacted]d being seen today for ongoing prenatal care.  She is currently monitored for the following issues for this high-risk pregnancy and has Major depression; Supervision of normal pregnancy; and History of asthma on their problem list.  Patient reports no complaints.  Contractions: Not present. Vag. Bleeding: None.  Movement: Present. Denies leaking of fluid.   The following portions of the patient's history were reviewed and updated as appropriate: allergies, current medications, past family history, past medical history, past social history, past surgical history and problem list. Problem list updated.  Objective:   Vitals:   05/01/21 0941  BP: (!) 110/58  Pulse: 72  Weight: 132 lb (59.9 kg)    Fetal Status: Fetal Heart Rate (bpm): 155   Movement: Present     General:  Alert, oriented and cooperative. Patient is in no acute distress.  Skin: Skin is warm and dry. No rash noted.   Cardiovascular: Normal heart rate noted  Respiratory: Normal respiratory effort, no problems with respiration noted  Abdomen: Soft, gravid, appropriate for gestational age. Pain/Pressure: Absent     Pelvic:  Cervical exam deferred        Extremities: Normal range of motion.  Edema: None  Mental Status: Normal mood and affect. Normal behavior. Normal judgment and thought content.   Urinalysis:        [place] MFM OB FOLLOW UP (Accession [id number]) (Order [id number]) Imaging Date: 04/17/2021 Department: [facility] Released By: Claudius Sis Authorizing: Ulice Dash, MD   Exam Status  Status  Final [99]   PACS Intelerad Image Link   Show images for [place] MFM OB FOLLOW UP Study Result  Narrative & Impression  ----------------------------------------------------------------------  OBSTETRICS REPORT                       (Signed Final 04/17/2021 04:52  pm) ---------------------------------------------------------------------- Patient Info  ID #:       [id number]                          D.O.B.:  September 03, 1995 (26 yrs)  Name:       Paige Young              Visit Date: 04/17/2021 02:15 pm ---------------------------------------------------------------------- Performed By  Attending:        Tama High MD        Ref. Address:     [place]  [place]                                                             [place]  Performed By:     Lelan Pons RDMS       [facility]  Referred By:      [place] ---------------------------------------------------------------------- Orders  #  Description                           Code        Ordered By  1  [place] MFM OB FOLLOW UP                   76816.01    YU FANG  2  [place] MFM UA CORD DOPPLER                [id number]    YU FANG ----------------------------------------------------------------------  #  Order #                     Accession #                Episode #  1  [id number]                   [id number]                 [id number]  2  [id number]                   [id number]                 [id number] ---------------------------------------------------------------------- Indications  Maternal care for known or suspected poor      O36.5920  fetal growth, second trimester, not applicable  or unspecified IUGR  [redacted] weeks gestation of pregnancy                Z3A.27  Asthma  O99.89 j45.909  LR NIPS - Female, Neg Horizon, Neg  AFP ---------------------------------------------------------------------- Fetal Evaluation  Num Of Fetuses:         1  Fetal Heart Rate(bpm):  152  Cardiac Activity:       Observed  Presentation:           Cephalic  Placenta:               Posterior Fundal  P. Cord Insertion:      Previously Visualized  Amniotic Fluid  AFI FV:      Within normal limits                              Largest Pocket(cm)                              5.26 ---------------------------------------------------------------------- Biometry  BPD:      71.2  mm     G. Age:  28w 4d         67  %    CI:        79.61   %    70 - 86                                                          FL/HC:      19.1   %    18.8 - 20.6  HC:      252.2  mm     G. Age:  27w 3d         13  %    HC/AC:      1.15        1.05 - 1.21  AC:      218.4  mm     G. Age:  26w 2d          9  %    FL/BPD:     67.7   %    71 - 87  FL:       48.2  mm     G. Age:  26w 1d          5  %    FL/AC:      22.1   %    20 - 24  LV:          4  mm  Est. FW:     942  gm      2 lb 1 oz      7  % ---------------------------------------------------------------------- OB History  Gravidity:    1 ---------------------------------------------------------------------- Gestational Age  LMP:           27w 5d        Date:  10/05/20                 EDD:   07/12/21  U/S Today:     27w 1d                                        EDD:   07/16/21  Best:          27w 5d  Det. By:  LMP  (10/05/20)          EDD:   07/12/21 ---------------------------------------------------------------------- Anatomy  Cranium:               Appears normal         LVOT:                   Previously seen  Cavum:                 Previously seen        Aortic Arch:            Previously seen  Ventricles:            Appears normal         Ductal Arch:            Previously seen  Choroid Plexus:        Previously seen        Diaphragm:              Previously seen  Cerebellum:             Previously seen        Stomach:                Appears normal, left                                                                        sided  Posterior Fossa:       Previously seen        Abdomen:                Previously seen  Nuchal Fold:           Previously seen        Abdominal Wall:         Previously seen  Face:                  Orbits and profile     Cord Vessels:           Previously seen                         previously seen  Lips:                  Previously seen        Kidneys:                Appear normal  Palate:                Previously seen        Bladder:                Appears normal  Thoracic:              Previously seen        Spine:                  Previously seen  Heart:                 Appears normal         Upper Extremities:  Previously seen                         (4CH, axis, and                         situs)  RVOT:                  Previously seen        Lower Extremities:      Previously seen  Other:  Fetus appears to be a female. Technically difficult due to fetal position.          Heels/feet and open hands/5th digits, nasal bone, lenses visualized          previously. ---------------------------------------------------------------------- Doppler - Fetal Vessels  Umbilical Artery   S/D     %tile      RI    %tile      PI    %tile            ADFV    RDFV   3.08       52    0.68       59    1.06       60               No      No ---------------------------------------------------------------------- Cervix Uterus Adnexa  Cervix  Not visualized (advanced GA >24wks)  Right Ovary  Visualized.  Left Ovary  Visualized. ---------------------------------------------------------------------- Impression  On today's ultrasound, the estimated fetal weight is at the 7th  percentile and the abdominal circumference measurement is  at the 9th percentile.  Amniotic fluid is normal and good fetal  activity seen.  Umbilical artery Doppler showed normal  forward  diastolic flow.  xxxxxxxxxxxxxxxxxxxxxxxxxxxxxxxxx  Consultation (see EPIC )  I had the pleasure of seeing Ms. Chmielewski today at the [facility]. G1 P0 at 27w 6d gestation.  She is  here for fetal growth assessment.  On previous growth assessment, the estimated fetal weight  was at the 12 percentile.  Her prepregnancy BMI is 20.8.  On cell-free fetal DNA screening, the risks of fetal  aneuploidies are not increased.  MSAFP screening showed  low risk for open neural tube defects.  Patient will be undergoing screening for gestational diabetes  next week.  Blood pressure today at her office is 107/50 mmHg.  Early-onset fetal growth restriction  I explained the finding of fetal growth restriction that is difficult  to differentiate from a constitutionally small for gestational  age fetus.  I discussed the possible causes of fetal growth restriction  including placental insufficiency (most common cause),  chromosomal anomalies and fetal infections.  Patient did not  give history of fever or rashes.  I explained that only amniocentesis will give a definitive result  on the fetal karyotype and some genetic conditions  Psychologist, counselling).  I explained amniocentesis procedure and  possible complication of preterm delivery (1 and 500  procedures).  I discussed ultrasound protocol of monitoring fetal growth  restriction.  Timing of delivery: Since small for gestational age fetuses  have a higher chance of having perinatal mortality and  morbidity, delivery at 14 to [redacted] weeks gestation is reasonable.  If, however, severe fetal growth restriction is seen with normal  antenatal testing, we will recommend delivery at [redacted] weeks  gestation. ---------------------------------------------------------------------- Recommendations  -An appointment was made for her to return in 2  weeks for  umbilical artery Doppler study.  -Fetal growth assessment in 3  weeks. ----------------------------------------------------------------------                  Tama High, MD Electronically Signed Final Report   04/17/2021 04:52 pm ----------------------------------------------------------------------   Result Hi Assessment and Plan:  Pregnancy: G1P0 at [redacted]w[redacted]d  1. Supervision of high risk pregnancy, antepartum  2. Poor fetal growth affecting management of mother in third trimester, single or unspecified fetus - patient is small and fetus may be constitutionally small - serial ultrasounds by MFM    Preterm labor symptoms and general obstetric precautions including but not limited to vaginal bleeding, contractions, leaking of fluid and fetal movement were reviewed in detail with the patient. Please refer to After Visit Summary for other counseling recommendations.   Return in about 2 weeks (around 05/15/2021) for [facility].   Shelly Bombard, MD  05/01/21

## 2021-05-02 LAB — GLUCOSE TOLERANCE, 2 HOURS W/ 1HR
Glucose, 1 hour: 171 mg/dL (ref 70–179)
Glucose, 2 hour: 114 mg/dL (ref 70–152)
Glucose, Fasting: 72 mg/dL (ref 70–91)

## 2021-05-02 LAB — CBC
Hematocrit: 32.1 % — ABNORMAL LOW (ref 34.0–46.6)
Hemoglobin: 11.2 g/dL (ref 11.1–15.9)
MCH: 32.3 pg (ref 26.6–33.0)
MCHC: 34.9 g/dL (ref 31.5–35.7)
MCV: 93 fL (ref 79–97)
Platelets: 234 10*3/uL (ref 150–450)
RBC: 3.47 x10E6/uL — ABNORMAL LOW (ref 3.77–5.28)
RDW: 12.1 % (ref 11.7–15.4)
WBC: 8.5 10*3/uL (ref 3.4–10.8)

## 2021-05-02 LAB — HIV ANTIBODY (ROUTINE TESTING W REFLEX): HIV Screen 4th Generation wRfx: NONREACTIVE

## 2021-05-02 LAB — RPR: RPR Ser Ql: NONREACTIVE

## 2021-05-05 ENCOUNTER — Encounter: Payer: Medicaid Other | Admitting: Licensed Clinical Social Worker

## 2021-05-08 ENCOUNTER — Ambulatory Visit (HOSPITAL_BASED_OUTPATIENT_CLINIC_OR_DEPARTMENT_OTHER): Payer: Medicaid Other | Admitting: *Deleted

## 2021-05-08 ENCOUNTER — Other Ambulatory Visit: Payer: Self-pay

## 2021-05-08 ENCOUNTER — Other Ambulatory Visit: Payer: Self-pay | Admitting: *Deleted

## 2021-05-08 ENCOUNTER — Ambulatory Visit: Payer: Medicaid Other | Admitting: *Deleted

## 2021-05-08 ENCOUNTER — Ambulatory Visit: Payer: Medicaid Other | Attending: Obstetrics and Gynecology

## 2021-05-08 VITALS — BP 97/50 | HR 91

## 2021-05-08 DIAGNOSIS — O99512 Diseases of the respiratory system complicating pregnancy, second trimester: Secondary | ICD-10-CM

## 2021-05-08 DIAGNOSIS — O365930 Maternal care for other known or suspected poor fetal growth, third trimester, not applicable or unspecified: Secondary | ICD-10-CM

## 2021-05-08 DIAGNOSIS — J45909 Unspecified asthma, uncomplicated: Secondary | ICD-10-CM | POA: Diagnosis not present

## 2021-05-08 DIAGNOSIS — O365931 Maternal care for other known or suspected poor fetal growth, third trimester, fetus 1: Secondary | ICD-10-CM

## 2021-05-08 DIAGNOSIS — O99513 Diseases of the respiratory system complicating pregnancy, third trimester: Secondary | ICD-10-CM | POA: Diagnosis not present

## 2021-05-08 DIAGNOSIS — Z3403 Encounter for supervision of normal first pregnancy, third trimester: Secondary | ICD-10-CM | POA: Insufficient documentation

## 2021-05-08 DIAGNOSIS — Z3A30 30 weeks gestation of pregnancy: Secondary | ICD-10-CM | POA: Diagnosis not present

## 2021-05-08 DIAGNOSIS — O365920 Maternal care for other known or suspected poor fetal growth, second trimester, not applicable or unspecified: Secondary | ICD-10-CM

## 2021-05-08 DIAGNOSIS — O365921 Maternal care for other known or suspected poor fetal growth, second trimester, fetus 1: Secondary | ICD-10-CM

## 2021-05-08 NOTE — Procedures (Signed)
Paige Young ?[birth year] ?[redacted]w[redacted]d ? ?Fetus A Non-Stress Test Interpretation for 05/08/21 ? ?Indication: IUGR ? ?Fetal Heart Rate A ?Mode: External ?Baseline Rate (A): 140 bpm ?Variability: Moderate ?Accelerations: 15 x 15 ?Decelerations: None ?Multiple birth?: No ? ?Uterine Activity ?Mode: Palpation, Toco ?Contraction Frequency (min): none ?Resting Tone Palpated: Relaxed ? ?Interpretation (Fetal Testing) ?Nonstress Test Interpretation: Reactive ?Overall Impression: Reassuring for gestational age ?Comments: Dr. Grace Bushy reviewed tracing ? ? ?

## 2021-05-14 ENCOUNTER — Other Ambulatory Visit: Payer: Self-pay | Admitting: *Deleted

## 2021-05-14 DIAGNOSIS — O365930 Maternal care for other known or suspected poor fetal growth, third trimester, not applicable or unspecified: Secondary | ICD-10-CM

## 2021-05-15 ENCOUNTER — Other Ambulatory Visit: Payer: Self-pay

## 2021-05-15 ENCOUNTER — Ambulatory Visit (INDEPENDENT_AMBULATORY_CARE_PROVIDER_SITE_OTHER): Payer: Medicaid Other | Admitting: Obstetrics and Gynecology

## 2021-05-15 VITALS — BP 95/58 | HR 80 | Wt 136.8 lb

## 2021-05-15 DIAGNOSIS — Z3A31 31 weeks gestation of pregnancy: Secondary | ICD-10-CM

## 2021-05-15 DIAGNOSIS — Z3403 Encounter for supervision of normal first pregnancy, third trimester: Secondary | ICD-10-CM

## 2021-05-15 DIAGNOSIS — O365990 Maternal care for other known or suspected poor fetal growth, unspecified trimester, not applicable or unspecified: Secondary | ICD-10-CM | POA: Insufficient documentation

## 2021-05-15 NOTE — Progress Notes (Signed)
? ?  PRENATAL VISIT NOTE ? ?Subjective:  ?Paige Young is a 26 y.o. G1P0 at 105w6d being seen today for ongoing prenatal care.  She is currently monitored for the following issues for this high-risk pregnancy and has Major depression; Supervision of normal pregnancy; History of asthma; and Fetal growth restriction antepartum on their problem list. ? ?Patient doing well with no acute concerns today. She reports no complaints.  Contractions: Not present. Vag. Bleeding: None.  Movement: Present. Denies leaking of fluid.  ? ?The following portions of the patient's history were reviewed and updated as appropriate: allergies, current medications, past family history, past medical history, past social history, past surgical history and problem list. Problem list updated. ? ?Objective:  ? ?Vitals:  ? 05/15/21 1315  ?BP: (!) 95/58  ?Pulse: 80  ?Weight: 136 lb 12.8 oz (62.1 kg)  ? ? ?Fetal Status: Fetal Heart Rate (bpm): 152 Fundal Height: 30 cm Movement: Present    ? ?General:  Alert, oriented and cooperative. Patient is in no acute distress.  ?Skin: Skin is warm and dry. No rash noted.   ?Cardiovascular: Normal heart rate noted  ?Respiratory: Normal respiratory effort, no problems with respiration noted  ?Abdomen: Soft, gravid, appropriate for gestational age.  Pain/Pressure: Absent     ?Pelvic: Cervical exam deferred        ?Extremities: Normal range of motion.  Edema: Trace  ?Mental Status:  Normal mood and affect. Normal behavior. Normal judgment and thought content.  ? ?Assessment and Plan:  ?Pregnancy: G1P0 at [redacted]w[redacted]d ? ?1. Encounter for supervision of normal first pregnancy in third trimester ?Continue routine care ? ?2. [redacted] weeks gestation of pregnancy ? ? ?3. Fetal growth restriction antepartum ?Reviewed last MFM scan, EFW at 3.8%, pt will start weekly BPP at 32 weeks, possible delivery at 37 weeks if FGR continues ? ?Preterm labor symptoms and general obstetric precautions including but not limited to vaginal  bleeding, contractions, leaking of fluid and fetal movement were reviewed in detail with the patient. ? ?Please refer to After Visit Summary for other counseling recommendations.  ? ?Return in about 2 weeks (around 05/29/2021) for ROB, in person. ? ? ?Lynnda Shields, MD ?Faculty Attending ?[facility] [place] [place] ?  ?

## 2021-05-15 NOTE — Progress Notes (Signed)
Pt presents for ROB. She has no concerns at this time.  °

## 2021-05-16 ENCOUNTER — Other Ambulatory Visit: Payer: Self-pay

## 2021-05-16 ENCOUNTER — Ambulatory Visit: Payer: Medicaid Other

## 2021-05-22 ENCOUNTER — Other Ambulatory Visit: Payer: Self-pay

## 2021-05-22 ENCOUNTER — Ambulatory Visit: Payer: Medicaid Other | Admitting: *Deleted

## 2021-05-22 ENCOUNTER — Ambulatory Visit (HOSPITAL_BASED_OUTPATIENT_CLINIC_OR_DEPARTMENT_OTHER): Payer: Medicaid Other | Admitting: *Deleted

## 2021-05-22 ENCOUNTER — Other Ambulatory Visit: Payer: Self-pay | Admitting: Maternal & Fetal Medicine

## 2021-05-22 ENCOUNTER — Ambulatory Visit: Payer: Medicaid Other | Attending: Obstetrics and Gynecology

## 2021-05-22 VITALS — BP 105/51 | HR 66

## 2021-05-22 DIAGNOSIS — O283 Abnormal ultrasonic finding on antenatal screening of mother: Secondary | ICD-10-CM | POA: Insufficient documentation

## 2021-05-22 DIAGNOSIS — O365930 Maternal care for other known or suspected poor fetal growth, third trimester, not applicable or unspecified: Secondary | ICD-10-CM | POA: Insufficient documentation

## 2021-05-22 DIAGNOSIS — Z3A32 32 weeks gestation of pregnancy: Secondary | ICD-10-CM | POA: Diagnosis not present

## 2021-05-22 DIAGNOSIS — O99513 Diseases of the respiratory system complicating pregnancy, third trimester: Secondary | ICD-10-CM

## 2021-05-22 DIAGNOSIS — J45909 Unspecified asthma, uncomplicated: Secondary | ICD-10-CM

## 2021-05-22 DIAGNOSIS — Z3403 Encounter for supervision of normal first pregnancy, third trimester: Secondary | ICD-10-CM | POA: Insufficient documentation

## 2021-05-22 DIAGNOSIS — Z362 Encounter for other antenatal screening follow-up: Secondary | ICD-10-CM | POA: Diagnosis not present

## 2021-05-22 NOTE — Procedures (Signed)
Paige Young ?October 04, 1995 ?[redacted]w[redacted]d ? ?Fetus A Non-Stress Test Interpretation for 05/22/21 ? ?Indication: Unsatisfactory BPP ? ?Fetal Heart Rate A ?Mode: External ?Baseline Rate (A): 150 bpm ?Variability: Moderate ?Accelerations: 15 x 15 ?Decelerations: None ?Multiple birth?: No ? ?Uterine Activity ?Mode: Palpation, Toco ?Contraction Frequency (min): 2-5 ?Contraction Duration (sec): 40-70 ?Contraction Quality: Mild ?Resting Tone Palpated: Relaxed ?Resting Time: Adequate ? ?Interpretation (Fetal Testing) ?Nonstress Test Interpretation: Reactive ?Overall Impression: Reassuring for gestational age ?Comments: Dr. Grace Bushy reviewed tracing ? ? ?

## 2021-05-23 ENCOUNTER — Other Ambulatory Visit: Payer: Self-pay | Admitting: *Deleted

## 2021-05-23 DIAGNOSIS — O365990 Maternal care for other known or suspected poor fetal growth, unspecified trimester, not applicable or unspecified: Secondary | ICD-10-CM

## 2021-05-27 ENCOUNTER — Telehealth: Payer: Self-pay

## 2021-05-29 ENCOUNTER — Ambulatory Visit: Payer: Medicaid Other | Attending: Maternal & Fetal Medicine

## 2021-05-29 ENCOUNTER — Other Ambulatory Visit: Payer: Self-pay

## 2021-05-29 ENCOUNTER — Ambulatory Visit: Payer: Medicaid Other | Admitting: *Deleted

## 2021-05-29 VITALS — BP 99/52 | HR 75

## 2021-05-29 DIAGNOSIS — O99513 Diseases of the respiratory system complicating pregnancy, third trimester: Secondary | ICD-10-CM | POA: Insufficient documentation

## 2021-05-29 DIAGNOSIS — Z362 Encounter for other antenatal screening follow-up: Secondary | ICD-10-CM | POA: Diagnosis not present

## 2021-05-29 DIAGNOSIS — J45909 Unspecified asthma, uncomplicated: Secondary | ICD-10-CM | POA: Diagnosis not present

## 2021-05-29 DIAGNOSIS — Z3A33 33 weeks gestation of pregnancy: Secondary | ICD-10-CM | POA: Diagnosis not present

## 2021-05-29 DIAGNOSIS — O365931 Maternal care for other known or suspected poor fetal growth, third trimester, fetus 1: Secondary | ICD-10-CM

## 2021-05-29 DIAGNOSIS — O365930 Maternal care for other known or suspected poor fetal growth, third trimester, not applicable or unspecified: Secondary | ICD-10-CM | POA: Insufficient documentation

## 2021-05-29 DIAGNOSIS — Z3403 Encounter for supervision of normal first pregnancy, third trimester: Secondary | ICD-10-CM

## 2021-05-29 DIAGNOSIS — O365921 Maternal care for other known or suspected poor fetal growth, second trimester, fetus 1: Secondary | ICD-10-CM

## 2021-05-30 ENCOUNTER — Encounter: Payer: Self-pay | Admitting: Obstetrics and Gynecology

## 2021-05-30 ENCOUNTER — Ambulatory Visit (INDEPENDENT_AMBULATORY_CARE_PROVIDER_SITE_OTHER): Payer: Medicaid Other | Admitting: Obstetrics and Gynecology

## 2021-05-30 VITALS — BP 94/59 | HR 76 | Wt 137.0 lb

## 2021-05-30 DIAGNOSIS — Z3403 Encounter for supervision of normal first pregnancy, third trimester: Secondary | ICD-10-CM

## 2021-05-30 DIAGNOSIS — O365990 Maternal care for other known or suspected poor fetal growth, unspecified trimester, not applicable or unspecified: Secondary | ICD-10-CM

## 2021-05-30 NOTE — Patient Instructions (Signed)

## 2021-05-30 NOTE — Progress Notes (Signed)
ROB [redacted] wks GA ?IUGR/ just under 4lbs 05/29/21 ?Given info on ordering breast pump ?No concerns ?

## 2021-05-30 NOTE — Progress Notes (Signed)
Subjective:  ?Paige Young is a 26 y.o. G1P0 at [redacted]w[redacted]d being seen today for ongoing prenatal care.  She is currently monitored for the following issues for this high-risk pregnancy and has Major depression; Supervision of normal pregnancy; History of asthma; and Fetal growth restriction antepartum on their problem list. ? ?Patient reports no complaints.  Contractions: Not present. Vag. Bleeding: None.  Movement: Present. Denies leaking of fluid.  ? ?The following portions of the patient's history were reviewed and updated as appropriate: allergies, current medications, past family history, past medical history, past social history, past surgical history and problem list. Problem list updated. ? ?Objective:  ? ?Vitals:  ? 05/30/21 1112  ?BP: (!) 94/59  ?Pulse: 76  ?Weight: 137 lb (62.1 kg)  ? ? ?Fetal Status:     Movement: Present    ? ?General:  Alert, oriented and cooperative. Patient is in no acute distress.  ?Skin: Skin is warm and dry. No rash noted.   ?Cardiovascular: Normal heart rate noted  ?Respiratory: Normal respiratory effort, no problems with respiration noted  ?Abdomen: Soft, gravid, appropriate for gestational age. Pain/Pressure: Absent     ?Pelvic:  Cervical exam deferred        ?Extremities: Normal range of motion.  Edema: None  ?Mental Status: Normal mood and affect. Normal behavior. Normal judgment and thought content.  ? ?Urinalysis:     ? ?Assessment and Plan:  ?Pregnancy: G1P0 at [redacted]w[redacted]d ? ?1. Encounter for supervision of normal first pregnancy in third trimester ?Stable ?GBS at next visit ? ?2. Fetal growth restriction antepartum ?Serial growth scans and weekly antenatal testing as per MFM ?IOL as per antenatal testing ? ?Preterm labor symptoms and general obstetric precautions including but not limited to vaginal bleeding, contractions, leaking of fluid and fetal movement were reviewed in detail with the patient. ?Please refer to After Visit Summary for other counseling recommendations.   ?Return in about 2 weeks (around 06/13/2021) for OB visit, face to face, MD only. ? ? ?Chancy Milroy, MD ?

## 2021-06-03 ENCOUNTER — Telehealth: Payer: Self-pay

## 2021-06-04 ENCOUNTER — Ambulatory Visit: Payer: Medicaid Other | Admitting: *Deleted

## 2021-06-04 ENCOUNTER — Ambulatory Visit: Payer: Medicaid Other | Attending: Maternal & Fetal Medicine

## 2021-06-04 VITALS — BP 102/48 | HR 74

## 2021-06-04 DIAGNOSIS — O365990 Maternal care for other known or suspected poor fetal growth, unspecified trimester, not applicable or unspecified: Secondary | ICD-10-CM | POA: Insufficient documentation

## 2021-06-04 DIAGNOSIS — J45909 Unspecified asthma, uncomplicated: Secondary | ICD-10-CM

## 2021-06-04 DIAGNOSIS — O365930 Maternal care for other known or suspected poor fetal growth, third trimester, not applicable or unspecified: Secondary | ICD-10-CM | POA: Diagnosis present

## 2021-06-04 DIAGNOSIS — Z3403 Encounter for supervision of normal first pregnancy, third trimester: Secondary | ICD-10-CM | POA: Insufficient documentation

## 2021-06-04 DIAGNOSIS — Z3A34 34 weeks gestation of pregnancy: Secondary | ICD-10-CM | POA: Diagnosis not present

## 2021-06-04 DIAGNOSIS — O99513 Diseases of the respiratory system complicating pregnancy, third trimester: Secondary | ICD-10-CM | POA: Diagnosis not present

## 2021-06-05 ENCOUNTER — Other Ambulatory Visit: Payer: Self-pay | Admitting: *Deleted

## 2021-06-05 DIAGNOSIS — O365930 Maternal care for other known or suspected poor fetal growth, third trimester, not applicable or unspecified: Secondary | ICD-10-CM

## 2021-06-09 ENCOUNTER — Ambulatory Visit: Payer: Medicaid Other | Admitting: *Deleted

## 2021-06-09 ENCOUNTER — Ambulatory Visit: Payer: Medicaid Other | Attending: Obstetrics and Gynecology

## 2021-06-09 ENCOUNTER — Other Ambulatory Visit: Payer: Self-pay | Admitting: Maternal & Fetal Medicine

## 2021-06-09 ENCOUNTER — Ambulatory Visit: Payer: Medicaid Other

## 2021-06-09 DIAGNOSIS — J45909 Unspecified asthma, uncomplicated: Secondary | ICD-10-CM

## 2021-06-09 DIAGNOSIS — O365990 Maternal care for other known or suspected poor fetal growth, unspecified trimester, not applicable or unspecified: Secondary | ICD-10-CM | POA: Insufficient documentation

## 2021-06-09 DIAGNOSIS — O99513 Diseases of the respiratory system complicating pregnancy, third trimester: Secondary | ICD-10-CM | POA: Insufficient documentation

## 2021-06-09 DIAGNOSIS — Z3A35 35 weeks gestation of pregnancy: Secondary | ICD-10-CM

## 2021-06-09 DIAGNOSIS — O365930 Maternal care for other known or suspected poor fetal growth, third trimester, not applicable or unspecified: Secondary | ICD-10-CM

## 2021-06-09 DIAGNOSIS — Z3403 Encounter for supervision of normal first pregnancy, third trimester: Secondary | ICD-10-CM

## 2021-06-09 NOTE — Procedures (Signed)
[name] ?[birth year] ?[redacted]w[redacted]d ? ?Fetus A Non-Stress Test Interpretation for 06/09/21 ? ?Indication: IUGR ? ?Fetal Heart Rate A ?Mode: External ?Baseline Rate (A): 145 bpm ?Variability: Moderate ?Accelerations: 15 x 15 ?Decelerations: None ?Multiple birth?: No ? ?Uterine Activity ?Mode: Palpation, Toco ?Contraction Frequency (min): Occas ?Contraction Quality: Mild ?Resting Tone Palpated: Relaxed ?Resting Time: Adequate ? ?Interpretation (Fetal Testing) ?Nonstress Test Interpretation: Reactive ?Comments: Dr. Donalee Citrin reviewed tracing. ? ? ?

## 2021-06-12 ENCOUNTER — Telehealth (INDEPENDENT_AMBULATORY_CARE_PROVIDER_SITE_OTHER): Payer: Medicaid Other | Admitting: Obstetrics and Gynecology

## 2021-06-12 ENCOUNTER — Encounter: Payer: Self-pay | Admitting: Obstetrics and Gynecology

## 2021-06-12 DIAGNOSIS — Z3403 Encounter for supervision of normal first pregnancy, third trimester: Secondary | ICD-10-CM

## 2021-06-12 DIAGNOSIS — O365990 Maternal care for other known or suspected poor fetal growth, unspecified trimester, not applicable or unspecified: Secondary | ICD-10-CM

## 2021-06-12 DIAGNOSIS — Z3A35 35 weeks gestation of pregnancy: Secondary | ICD-10-CM

## 2021-06-12 DIAGNOSIS — O365930 Maternal care for other known or suspected poor fetal growth, third trimester, not applicable or unspecified: Secondary | ICD-10-CM

## 2021-06-12 MED ORDER — BLOOD PRESSURE KIT DEVI: 1.0000 | 0 refills | Status: AC

## 2021-06-12 NOTE — Progress Notes (Signed)
I connected with  Paige Young on 06/12/21 by a video enabled telemedicine application and verified that I am speaking with the correct person using two identifiers. ?  ?I discussed the limitations of evaluation and management by telemedicine. The patient expressed understanding and agreed to proceed.  ? ?Mychart OB, reports no complaints today. ?

## 2021-06-12 NOTE — Progress Notes (Signed)
? ?OBSTETRICS PRENATAL VIRTUAL VISIT ENCOUNTER NOTE ? ?Provider location: [facility] at [place]  ? ?Patient location: Home ? ?I connected with Paige Young on 06/12/21 at  1:30 PM EDT by MyChart Video Encounter and verified that I am speaking with the correct person using two identifiers. I discussed the limitations, risks, security and privacy concerns of performing an evaluation and management service virtually and the availability of in person appointments. I also discussed with the patient that there may be a patient responsible charge related to this service. The patient expressed understanding and agreed to proceed. ?Subjective:  ?Paige Young is a 26 y.o. G1P0 at [redacted]w[redacted]d being seen today for ongoing prenatal care.  She is currently monitored for the following issues for this high-risk pregnancy and has Major depression; Supervision of normal pregnancy; History of asthma; and Fetal growth restriction antepartum on their problem list. ? ?Patient reports no complaints.  Contractions: Irritability. Vag. Bleeding: None.  Movement: Present. Denies any leaking of fluid.  ? ?The following portions of the patient's history were reviewed and updated as appropriate: allergies, current medications, past family history, past medical history, past social history, past surgical history and problem list.  ? ?Objective:  ?There were no vitals filed for this visit. ? ?Fetal Status:     Movement: Present    ? ?General:  Alert, oriented and cooperative. Patient is in no acute distress.  ?Respiratory: Normal respiratory effort, no problems with respiration noted  ?Mental Status: Normal mood and affect. Normal behavior. Normal judgment and thought content.  ?Rest of physical exam deferred due to type of encounter ? ?Imaging: ?[place] MFM FETAL BPP W/NONSTRESS ? ?Result Date: 06/09/2021 ?----------------------------------------------------------------------  OBSTETRICS REPORT                       (Signed  Final 06/09/2021 02:58 pm) ---------------------------------------------------------------------- Patient Info  ID #:       [id number]                          D.O.B.:  [birth year] (26 yrs)  Name:       Paige Young              Visit Date: 06/09/2021 12:52 pm ---------------------------------------------------------------------- Performed By  Attending:        Tama High MD        Ref. Address:     [place]  [place]                                                             [place]  Performed By:     Margaretann Loveless     [facility]                                                             [place] [facility]                                                             [facility]  Referred By:      [facility] ---------------------------------------------------------------------- Orders  #  Description                           Code        Ordered By  1  [place] MFM UA CORD DOPPLER                76820.02    Sander Nephew  2  [place] MFM FETAL BPP                      [id number]     Dian Situ ----------------------------------------------------------------------  #  Order #                     Accession #                Episode #  1  [id number]                   [id number]                 [id number]  2  [id number]                   [id number]  [id number] ---------------------------------------------------------------------- Indications  Maternal care for known or suspected poor      O36.5930  fetal growth, third trimester, not applicable or  unspecified IUGR  Asthma                                          O99.89 j45.909  LR NIPS - Female, Neg Horizon, Neg AFP  [redacted] weeks gestation of pregnancy                Z3A.35 ---------------------------------------------------------------------- Fetal Evaluation  Num Of Fetuses:         1  Fetal Heart Rate(bpm):  147  Cardiac Activity:       Observed  Presentation:           Cephalic  Placenta:               Posterior  P. Cord Insertion:      Previously Visualized  Amniotic Fluid  AFI FV:      Within normal limits  AFI Sum(cm)     %Tile       Largest Pocket(cm)  9.5             17          3.46  RUQ(cm)       RLQ(cm)       LUQ(cm)        LLQ(cm)  2.81          1.64          1.59           3.46 ---------------------------------------------------------------------- Biophysical Evaluation  Amniotic F.V:   Pocket => 2 cm             F. Tone:        Observed  F. Movement:    Observed                   N.S.T:          Reactive  F. Breathing:   Observed                   Score:          10/10 ---------------------------------------------------------------------- OB History  Gravidity:    1 ---------------------------------------------------------------------- Gestational Age  LMP:           35w 2d        Date:  10/05/20                  EDD:   07/12/21  Best:          [name] 2d     Det. By:  LMP  (10/05/20)          EDD:   07/12/21 ---------------------------------------------------------------------- Anatomy  Cranium:               Appears normal         LVOT:                   Previously seen  Cavum:                 Previously seen        Aortic Arch:            Previously seen  Ventricles:            Previously seen  Ductal Arch:            Previously seen  Choroid Plexus:        Previously seen        Diaphragm:              Appears normal  Cerebellum:            Previously seen        Stomach:                Appears normal, left                                                                        sided  Posterior Fossa:       Previously seen        Abdomen:                Appears  normal  Nuchal Fold:           Previously seen        Abdominal Wall:         Previously seen  Face:                  Profile nl; orbits     Cord Vessels:           Previously seen                         previously visualized  Lips:                  Previously seen        Kidneys:                Appear normal  Palate:                Previously seen        Bladder:                Appears normal  Thoracic:              Appears normal         Spine:                  Previously seen  Heart:                 Appears normal         Upper Extremities:      Previously seen                         (4CH, axis, and                         situs)  RVOT:                  Previously seen        Lower Extremities:      Previously seen  Other:  Female gender previously seen. Heels/feet and open hands/5th digits,          nasal bone, lenses visualized previously. ---------------------------------------------------------------------- Doppler - Fetal Vessels  Umbilical Artery   S/D     %  tile      RI    %tile      PI    %tile     PSV    ADFV    RDFV                                                     (cm/s)   2.15       29    0.54       31    0.63        9     50.6      No      No ---------------------------------------------------------------------- Cervix Uterus Adnexa  Cervix  Not visualized (advanced GA >24wks)  Uterus  Normal shape and size.  Right Ovary  Within normal limits.  Left Ovary  Within normal limits. ---------------------------------------------------------------------- Impression  Severe fetal growth restriction.  On previous fetal growth  assessment, the estimated fetal weight was at the 3rd  percentile.  Patient returned for antenatal testing.  Amniotic fluid is normal and good fetal activity seen.  Umbilical artery Doppler showed normal forward diastolic  flow.  NST is reactive.  BPP 10/10.  Patient has an appointment for fetal growth assessment next  week.  If severe fetal growth restriction persists, we will   recommend delivery at [redacted] weeks gestation. If she does not  have severe fetal growth restriction, delivery at 26 or 39  weeks' gestation is reasonable.  Patient has a prenatal visit appointment on 06/12/21. --------

## 2021-06-16 ENCOUNTER — Encounter: Payer: Self-pay | Admitting: *Deleted

## 2021-06-16 ENCOUNTER — Ambulatory Visit: Payer: Medicaid Other | Attending: Obstetrics

## 2021-06-16 ENCOUNTER — Other Ambulatory Visit: Payer: Self-pay | Admitting: Maternal & Fetal Medicine

## 2021-06-16 ENCOUNTER — Ambulatory Visit: Payer: Medicaid Other | Admitting: *Deleted

## 2021-06-16 VITALS — BP 109/61 | HR 70

## 2021-06-16 DIAGNOSIS — O365930 Maternal care for other known or suspected poor fetal growth, third trimester, not applicable or unspecified: Secondary | ICD-10-CM | POA: Insufficient documentation

## 2021-06-16 DIAGNOSIS — O99513 Diseases of the respiratory system complicating pregnancy, third trimester: Secondary | ICD-10-CM | POA: Insufficient documentation

## 2021-06-16 DIAGNOSIS — Z3A36 36 weeks gestation of pregnancy: Secondary | ICD-10-CM | POA: Insufficient documentation

## 2021-06-16 DIAGNOSIS — J45909 Unspecified asthma, uncomplicated: Secondary | ICD-10-CM

## 2021-06-16 DIAGNOSIS — Z3403 Encounter for supervision of normal first pregnancy, third trimester: Secondary | ICD-10-CM

## 2021-06-16 DIAGNOSIS — O365990 Maternal care for other known or suspected poor fetal growth, unspecified trimester, not applicable or unspecified: Secondary | ICD-10-CM

## 2021-06-20 ENCOUNTER — Telehealth (HOSPITAL_COMMUNITY): Payer: Self-pay | Admitting: *Deleted

## 2021-06-20 ENCOUNTER — Ambulatory Visit (INDEPENDENT_AMBULATORY_CARE_PROVIDER_SITE_OTHER): Payer: Medicaid Other | Admitting: Obstetrics and Gynecology

## 2021-06-20 ENCOUNTER — Other Ambulatory Visit (HOSPITAL_COMMUNITY)
Admission: RE | Admit: 2021-06-20 | Discharge: 2021-06-20 | Disposition: A | Discharge status: Home / Self Care | Payer: Medicaid Other | Source: Ambulatory Visit | Attending: Obstetrics and Gynecology | Admitting: Obstetrics and Gynecology

## 2021-06-20 ENCOUNTER — Encounter: Payer: Self-pay | Admitting: Obstetrics and Gynecology

## 2021-06-20 VITALS — BP 96/61 | HR 68 | Wt 137.9 lb

## 2021-06-20 DIAGNOSIS — Z3A37 37 weeks gestation of pregnancy: Secondary | ICD-10-CM

## 2021-06-20 DIAGNOSIS — O365990 Maternal care for other known or suspected poor fetal growth, unspecified trimester, not applicable or unspecified: Secondary | ICD-10-CM

## 2021-06-20 DIAGNOSIS — Z3403 Encounter for supervision of normal first pregnancy, third trimester: Secondary | ICD-10-CM | POA: Insufficient documentation

## 2021-06-20 DIAGNOSIS — O365931 Maternal care for other known or suspected poor fetal growth, third trimester, fetus 1: Secondary | ICD-10-CM

## 2021-06-20 DIAGNOSIS — F329 Major depressive disorder, single episode, unspecified: Secondary | ICD-10-CM

## 2021-06-20 NOTE — Progress Notes (Signed)
Pt in office for [place] visit. Pt is requesting counseling services. She does not have any other concerns at this time.  ?

## 2021-06-20 NOTE — Telephone Encounter (Signed)
Preadmission screen  

## 2021-06-20 NOTE — Progress Notes (Signed)
? ?  PRENATAL VISIT NOTE ? ?Subjective:  ?Paige Young is a 26 y.o. G1P0 at [redacted]w[redacted]d being seen today for ongoing prenatal care.  She is currently monitored for the following issues for this high-risk pregnancy and has Major depression; Supervision of normal pregnancy; History of asthma; and Fetal growth restriction antepartum on their problem list. ? ?Patient doing well with no acute concerns today. She reports no complaints.  Contractions: Not present. Vag. Bleeding: None.  Movement: Present. Denies leaking of fluid.  ? ?The following portions of the patient's history were reviewed and updated as appropriate: allergies, current medications, past family history, past medical history, past social history, past surgical history and problem list. Problem list updated. ? ?Objective:  ? ?Vitals:  ? 06/20/21 0917  ?BP: 96/61  ?Pulse: 68  ?Weight: 137 lb 14.4 oz (62.6 kg)  ? ? ?Fetal Status: Fetal Heart Rate (bpm): 154 (Simultaneous filing. User may not have seen previous data.) Fundal Height: 34 cm Movement: Present    ? ?General:  Alert, oriented and cooperative. Patient is in no acute distress.  ?Skin: Skin is warm and dry. No rash noted.   ?Cardiovascular: Normal heart rate noted  ?Respiratory: Normal respiratory effort, no problems with respiration noted  ?Abdomen: Soft, gravid, appropriate for gestational age.  Pain/Pressure: Present     ?Pelvic: Cervical exam deferred        ?Extremities: Normal range of motion.  Edema: Trace  ?Mental Status:  Normal mood and affect. Normal behavior. Normal judgment and thought content.  ? ?Assessment and Plan:  ?Pregnancy: G1P0 at [redacted]w[redacted]d ? ?1. Encounter for supervision of normal first pregnancy in third trimester ?Cultures taken today ? ?- Culture, beta strep (group b only) ?- Cervicovaginal ancillary only( [place]) ? ?2. [redacted] weeks gestation of pregnancy ? ? ?3. Major depressive disorder, remission status unspecified, unspecified whether recurrent ? ?- Ambulatory referral to  [place] ? ?4. Fetal growth restriction antepartum ?Last EFW was at 2.6%, MFM recommended delivery by 38 weeks.  Pt is somewhat reluctant, but she is willing to have IOL at 38 weeks.  Discussed risk of poor outcome   with critical low fetal weight.  Pt continues fetal testing. ?Orders have been placed. ? ?Term labor symptoms and general obstetric precautions including but not limited to vaginal bleeding, contractions, leaking of fluid and fetal movement were reviewed in detail with the patient. ? ?Please refer to After Visit Summary for other counseling recommendations.  ? ?No follow-ups on file. ? ? ?Lynnda Shields, MD ?Faculty Attending ?[facility] [place] ?  ?

## 2021-06-23 ENCOUNTER — Other Ambulatory Visit: Payer: Self-pay | Admitting: Family Medicine

## 2021-06-23 ENCOUNTER — Telehealth (HOSPITAL_COMMUNITY): Payer: Self-pay | Admitting: *Deleted

## 2021-06-23 LAB — CERVICOVAGINAL ANCILLARY ONLY
Chlamydia: NEGATIVE
Comment: NEGATIVE
Comment: NEGATIVE
Comment: NORMAL
Neisseria Gonorrhea: NEGATIVE
Trichomonas: NEGATIVE

## 2021-06-23 NOTE — Telephone Encounter (Signed)
Preadmission screen  

## 2021-06-24 ENCOUNTER — Telehealth (HOSPITAL_COMMUNITY): Payer: Self-pay | Admitting: *Deleted

## 2021-06-24 LAB — CULTURE, BETA STREP (GROUP B ONLY): Strep Gp B Culture: NEGATIVE

## 2021-06-24 NOTE — Telephone Encounter (Signed)
Preadmission screen  

## 2021-06-25 ENCOUNTER — Telehealth (HOSPITAL_COMMUNITY): Payer: Self-pay | Admitting: *Deleted

## 2021-06-25 ENCOUNTER — Encounter (HOSPITAL_COMMUNITY): Payer: Self-pay | Admitting: *Deleted

## 2021-06-25 ENCOUNTER — Ambulatory Visit: Payer: Medicaid Other

## 2021-06-25 NOTE — Telephone Encounter (Signed)
Preadmission screen  

## 2021-06-28 ENCOUNTER — Encounter (HOSPITAL_COMMUNITY): Payer: Self-pay | Admitting: Obstetrics and Gynecology

## 2021-06-28 ENCOUNTER — Inpatient Hospital Stay (HOSPITAL_COMMUNITY): Payer: Medicaid Other

## 2021-06-28 ENCOUNTER — Inpatient Hospital Stay (HOSPITAL_COMMUNITY)
Admission: AD | Admit: 2021-06-28 | Discharge: 2021-07-02 | DRG: 787 | Disposition: A | Discharge status: Home / Self Care | Payer: Medicaid Other | Attending: Obstetrics & Gynecology | Admitting: Obstetrics & Gynecology

## 2021-06-28 DIAGNOSIS — D62 Acute posthemorrhagic anemia: Secondary | ICD-10-CM | POA: Diagnosis not present

## 2021-06-28 DIAGNOSIS — O9081 Anemia of the puerperium: Secondary | ICD-10-CM | POA: Diagnosis not present

## 2021-06-28 DIAGNOSIS — O1205 Gestational edema, complicating the puerperium: Secondary | ICD-10-CM | POA: Diagnosis present

## 2021-06-28 DIAGNOSIS — O99334 Smoking (tobacco) complicating childbirth: Secondary | ICD-10-CM | POA: Diagnosis present

## 2021-06-28 DIAGNOSIS — O365990 Maternal care for other known or suspected poor fetal growth, unspecified trimester, not applicable or unspecified: Secondary | ICD-10-CM | POA: Diagnosis present

## 2021-06-28 DIAGNOSIS — O0993 Supervision of high risk pregnancy, unspecified, third trimester: Principal | ICD-10-CM

## 2021-06-28 DIAGNOSIS — F17210 Nicotine dependence, cigarettes, uncomplicated: Secondary | ICD-10-CM | POA: Diagnosis present

## 2021-06-28 DIAGNOSIS — O0990 Supervision of high risk pregnancy, unspecified, unspecified trimester: Secondary | ICD-10-CM

## 2021-06-28 DIAGNOSIS — O365930 Maternal care for other known or suspected poor fetal growth, third trimester, not applicable or unspecified: Principal | ICD-10-CM | POA: Diagnosis present

## 2021-06-28 DIAGNOSIS — Z3A38 38 weeks gestation of pregnancy: Secondary | ICD-10-CM

## 2021-06-28 LAB — CBC
HCT: 38.1 % (ref 36.0–46.0)
Hemoglobin: 13.2 g/dL (ref 12.0–15.0)
MCH: 31.6 pg (ref 26.0–34.0)
MCHC: 34.6 g/dL (ref 30.0–36.0)
MCV: 91.1 fL (ref 80.0–100.0)
Platelets: 257 10*3/uL (ref 150–400)
RBC: 4.18 MIL/uL (ref 3.87–5.11)
RDW: 13.4 % (ref 11.5–15.5)
WBC: 8.9 10*3/uL (ref 4.0–10.5)
nRBC: 0 % (ref 0.0–0.2)

## 2021-06-28 LAB — TYPE AND SCREEN
ABO/RH(D): O POS
Antibody Screen: NEGATIVE

## 2021-06-28 MED ORDER — SOD CITRATE-CITRIC ACID 500-334 MG/5ML PO SOLN
30.0000 mL | ORAL | Status: DC | PRN
Start: 2021-06-28 — End: 2021-06-30
  Administered 2021-06-30: 30 mL via ORAL
  Filled 2021-06-28: qty 30

## 2021-06-28 MED ORDER — MISOPROSTOL 25 MCG QUARTER TABLET
25.0000 ug | ORAL_TABLET | ORAL | Status: DC | PRN
  Administered 2021-06-28: 50 ug via VAGINAL
  Filled 2021-06-28 (×2): qty 1

## 2021-06-28 MED ORDER — TERBUTALINE SULFATE 1 MG/ML IJ SOLN
0.2500 mg | Freq: Once | INTRAMUSCULAR | Status: AC | PRN
  Administered 2021-06-30: 0.25 mg via SUBCUTANEOUS
  Filled 2021-06-28: qty 1

## 2021-06-28 MED ORDER — ONDANSETRON HCL 4 MG/2ML IJ SOLN
4.0000 mg | Freq: Four times a day (QID) | INTRAMUSCULAR | Status: DC | PRN
Start: 2021-06-28 — End: 2021-06-30

## 2021-06-28 MED ORDER — ACETAMINOPHEN 325 MG PO TABS: 650.0000 mg | ORAL_TABLET | ORAL | Status: DC | PRN

## 2021-06-28 MED ORDER — FENTANYL CITRATE (PF) 100 MCG/2ML IJ SOLN
50.0000 ug | INTRAMUSCULAR | Status: DC | PRN
  Administered 2021-06-28: 50 ug via INTRAVENOUS
  Administered 2021-06-29 (×6): 100 ug via INTRAVENOUS
  Filled 2021-06-28 (×7): qty 2

## 2021-06-28 MED ORDER — LIDOCAINE HCL (PF) 1 % IJ SOLN: 30.0000 mL | INTRAMUSCULAR | Status: DC | PRN

## 2021-06-28 MED ORDER — SODIUM CHLORIDE 0.9 % IV SOLN: 5.0000 10*6.[IU] | Freq: Once | INTRAVENOUS | Status: DC

## 2021-06-28 MED ORDER — LACTATED RINGERS IV SOLN
500.0000 mL | INTRAVENOUS | Status: DC | PRN
  Administered 2021-06-29: 1000 mL via INTRAVENOUS

## 2021-06-28 MED ORDER — LACTATED RINGERS IV SOLN
INTRAVENOUS | Status: DC
  Administered 2021-06-29: 125 mL via INTRAVENOUS

## 2021-06-28 MED ORDER — OXYCODONE-ACETAMINOPHEN 5-325 MG PO TABS: 1.0000 | ORAL_TABLET | ORAL | Status: DC | PRN

## 2021-06-28 MED ORDER — OXYTOCIN-SODIUM CHLORIDE 30-0.9 UT/500ML-% IV SOLN: 1.0000 m[IU]/min | INTRAVENOUS | Status: DC

## 2021-06-28 MED ORDER — OXYTOCIN BOLUS FROM INFUSION: 333.0000 mL | Freq: Once | INTRAVENOUS | Status: DC

## 2021-06-28 MED ORDER — PENICILLIN G POT IN DEXTROSE 60000 UNIT/ML IV SOLN: 3.0000 10*6.[IU] | INTRAVENOUS | Status: DC

## 2021-06-28 MED ORDER — MISOPROSTOL 50MCG HALF TABLET
50.0000 ug | ORAL_TABLET | ORAL | Status: DC | PRN
  Administered 2021-06-28 – 2021-06-29 (×4): 50 ug via ORAL
  Filled 2021-06-28 (×4): qty 1

## 2021-06-28 MED ORDER — OXYTOCIN-SODIUM CHLORIDE 30-0.9 UT/500ML-% IV SOLN: 2.5000 [IU]/h | INTRAVENOUS | Status: DC

## 2021-06-28 NOTE — Progress Notes (Signed)
Paige Young is a 26 y.o. G1P0 at 103w1d admitted for induction of labor due to suspected FGR (2.5%ile). ? ?Subjective: ?Doing well. Starting to feel some cramping.  ? ?Patient at this time would still like to avoid a foley balloon.  ?Objective: ?BP (!) 101/49   Pulse 60   Temp 98.4 ?F (36.9 ?C) (Oral)   Resp 16   Ht 5\' 2"  (1.575 m)   Wt 64.1 kg   LMP 10/05/2020 (Exact Date)   SpO2 97%   BMI 25.84 kg/m?  ?No intake/output data recorded. ?No intake/output data recorded. ? ?FHT:  FHR: 130 bpm, variability: moderate,  accelerations:  Present,  decelerations:  Absent ?UC:   irregular, every 1-4 minutes ?SVE:   Dilation: 1.5 ?Effacement (%): 50 ?Station: -3 ?Exam by:: Dr. [email or web address] ? ?Labs: ?Lab Results  ?Component Value Date  ? WBC 8.9 06/28/2021  ? HGB 13.2 06/28/2021  ? HCT 38.1 06/28/2021  ? MCV 91.1 06/28/2021  ? PLT 257 06/28/2021  ? ? ?Assessment / Plan: ? G1P0 at [redacted]w[redacted]d admitted for induction of labor due to suspected FGR (2.5%ile). ? ?Labor: s/p cytotec x2. Cervix still about 1 to 1.5 cm and 50% effaced. Patient continues to want to avoid foley balloon at this time. We discussed that if after 4 cytotecs continues to be in early ripening stage it could be beneficial to revisit the FB at that time. Patient expressed understanding. ? ?Will do another cytotec at this time ? ?Fetal Wellbeing:  Category I ?Pain Control:   plans epidural ?I/D:   GBS negative ? ? ?#FGR ?2.6%ile on 4/10. AC 2%ile. Normal dopplers at that time. ? ? ?6/10, MD, MPH ?OB Fellow, Faculty Practice ? ? ?

## 2021-06-28 NOTE — H&P (Signed)
Paige Young is a 26 y.o. female presenting for IOL for FGR (2.5%).   ? ?MFM [place] on 06/16/21: ?BPP 8/8, EFW 4 lbs 12 oz, 2.6%, cephalic, posterior placenta, AFI 11.7. Dopplers with normal S/D ratio. ? ? ?Nursing Staff Provider  ?Office Location  [facility] Dating  LMP  ?Language  ENGLISH Anatomy US  02/18/2021 normal, anatomy f/u 4 weeks  ?Flu Vaccine  DECLINED 02/05/2021 Genetic/Carrier Screen  NIPS:   Low risk female ?AFP:   negative ?Horizon:  ?TDaP Vaccine   Defer to next visit Hgb A1C or  ?GTT Early  ?Third trimester   ?COVID Vaccine    LAB RESULTS   ?Rhogam   Blood Type O/Positive/-- (11/30 1132)   ?Baby Feeding Plan BREASTFEED Antibody Negative (11/30 1132)  ?Contraception YES-undecided method Rubella 5.30 (11/30 1132)  ?Circumcision YES RPR Non Reactive (11/30 1132)   ?Pediatrician  UNSURE HBsAg Negative (11/30 1132)   ?Support Person FOB HCVAb Negative  ?Prenatal Classes  HIV Non Reactive (11/30 1132)     ?BTL Consent  GBS   negative ?(For PCN allergy, check sensitivities)   ?VBAC Consent  Pap 03/2020 - records request done for [facility]  ?     ?DME Rx [ ]  BP cuff ?[ ]  Weight Scale Waterbirth  [ ]  Class [ ]  Consent [ ]  CNM visit  ?PHQ9 & GAD7  ] new OB ?[ X ] 28 weeks  ?[  ] 36 weeks Induction  [ ]  Orders Entered [ ] Foley Y/N  ? ?OB History   ? ? Gravida  ?1  ? Para  ?   ? Term  ?   ? Preterm  ?   ? AB  ?   ? Living  ?   ?  ? ? SAB  ?   ? IAB  ?   ? Ectopic  ?   ? Multiple  ?   ? Live Births  ?   ?   ?  ?  ? ?Past Medical History:  ?Diagnosis Date  ? Asthma   ? Seasonal allergies   ? ?History reviewed. No pertinent surgical history. ?Family History: family history includes Asthma in her mother; Hypertension in her father. ?Social History:  reports that she has been smoking cigarettes. She has never used smokeless tobacco. She reports that she does not currently use alcohol. She reports that she does not currently use drugs after having used the following drugs: Marijuana. ? ? ?  ?Maternal  Diabetes: No ?Genetic Screening: Normal ?Maternal Ultrasounds/Referrals: IUGR ?Fetal Ultrasounds or other Referrals:  None ?Maternal Substance Abuse:  No ?Significant Maternal Medications:  None ?Significant Maternal Lab Results:  Group B Strep negative ?Other Comments:  None ? ?Review of Systems  ?Constitutional:  Negative for chills, fatigue and fever.  ?Eyes:  Negative for visual disturbance.  ?Respiratory:  Negative for shortness of breath.   ?Cardiovascular:  Negative for chest pain.  ?Gastrointestinal:  Negative for abdominal pain and vomiting.  ?Genitourinary:  Negative for difficulty urinating, dysuria, flank pain, pelvic pain, vaginal bleeding, vaginal discharge and vaginal pain.  ?Neurological:  Negative for dizziness and headaches.  ?Psychiatric/Behavioral: Negative.    ?Maternal Medical History:  ?Reason for admission: IOL for FGR ? ?Contractions: Frequency: irregular.   ?Perceived severity is mild.   ?Fetal activity: Perceived fetal activity is normal.   ?Prenatal complications: IUGR.   ?Prenatal Complications - Diabetes: none. ? ?  ?Blood pressure 126/75, temperature 98.7 ?F (37.1 ?C), temperature source Axillary,  resp. rate 16, height 5\' 2"  (1.575 m), weight 64.1 kg, last menstrual period 10/05/2020. ?Maternal Exam:  ?Uterine Assessment: Contraction strength is mild.  Contraction frequency is irregular.  ?Abdomen: Fetal presentation: vertex ?Cervix: Cervix evaluated by digital exam.   ? ? ?Fetal Exam ?Fetal Monitor Review: Mode: ultrasound.   ?Baseline rate: 135.  ?Variability: moderate (6-25 bpm).   ?Pattern: accelerations present and no decelerations.   ?Fetal State Assessment: Category I - tracings are normal. ? ?Physical Exam ?Vitals and nursing note reviewed.  ?Constitutional:   ?   Appearance: She is well-developed.  ?Cardiovascular:  ?   Rate and Rhythm: Normal rate and regular rhythm.  ?   Heart sounds: Normal heart sounds.  ?Pulmonary:  ?   Effort: Pulmonary effort is normal.  ?Abdominal:  ?    Palpations: Abdomen is soft.  ?Musculoskeletal:     ?   General: Normal range of motion.  ?   Cervical back: Normal range of motion.  ?Skin: ?   General: Skin is warm and dry.  ?Neurological:  ?   Mental Status: She is alert and oriented to person, place, and time.  ?Psychiatric:     ?   Behavior: Behavior normal.     ?   Thought Content: Thought content normal.     ?   Judgment: Judgment normal.  ?  ?Prenatal labs: ?ABO, Rh: --/--/O POS (04/22 1210) ?Antibody: PENDING (04/22 1210) ?Rubella: 5.30 (11/30 1132) ?RPR: Non Reactive (02/23 1116)  ?HBsAg: Negative (11/30 1132)  ?HIV: Non Reactive (02/23 1116)  ?GBS: Negative/-- (04/14 1113)  ? ?Assessment/Plan: ?G1P0  at [redacted]w[redacted]d  by early [redacted]w[redacted]d ?FGR, 2.6% ?GBS neg ? ?Admit for IOL ?Discussed options with pt who declines foley balloon at this time ?Cytotec buccal for cervical ripening ?IV pain medications or nitrous oxide for pain relief PRN ?May have epidural when desired ? ?[place] Leftwich-Kirby ?06/28/2021, 1:29 PM ? ? ? ? ?

## 2021-06-28 NOTE — Progress Notes (Signed)
Paige Young is a 26 y.o. G1P0 at [redacted]w[redacted]d admitted for induction of labor due to FGR 2.5%. ? ?Subjective: ?Pt feeling cramping, family in room for support. ? ?Objective: ?BP (!) 102/53 (BP Location: Left Arm)   Pulse 64   Temp 98.9 ?F (37.2 ?C) (Oral)   Resp 16   Ht 5\' 2"  (1.575 m)   Wt 64.1 kg   LMP 10/05/2020 (Exact Date)   SpO2 97%   BMI 25.84 kg/m?  ?No intake/output data recorded. ?No intake/output data recorded. ? ?FHT:  FHR: 140 bpm, variability: moderate,  accelerations:  Present,  decelerations:  Absent ?UC:   irregular, every 2-4 minutes, mild to palpation ?SVE:   Dilation: 1 ?Effacement (%): 50 ?Station: -2 ?Exam by:: [email or web address], RN ? ?Labs: ?Lab Results  ?Component Value Date  ? WBC 8.9 06/28/2021  ? HGB 13.2 06/28/2021  ? HCT 38.1 06/28/2021  ? MCV 91.1 06/28/2021  ? PLT 257 06/28/2021  ? ? ?Assessment / Plan: ?Induction of labor due to FGR ?Cytotec, s/p 2 doses, last dose at 1647 ? ?Labor: Progressing normally ?Preeclampsia:   n/a ?Fetal Wellbeing:  Category I ?Pain Control:  Labor support without medications ?I/D:   GBS neg ?Anticipated MOD:  NSVD ? ?06/30/2021 Leftwich-Kirby ?06/28/2021, 6:53 PM ? ? ?

## 2021-06-29 LAB — RPR: RPR Ser Ql: NONREACTIVE

## 2021-06-29 MED ORDER — PHENYLEPHRINE 80 MCG/ML (10ML) SYRINGE FOR IV PUSH (FOR BLOOD PRESSURE SUPPORT): 80.0000 ug | PREFILLED_SYRINGE | INTRAVENOUS | Status: DC | PRN

## 2021-06-29 MED ORDER — EPHEDRINE 5 MG/ML INJ: 10.0000 mg | INTRAVENOUS | Status: DC | PRN

## 2021-06-29 MED ORDER — LACTATED RINGERS IV SOLN: 500.0000 mL | Freq: Once | INTRAVENOUS | Status: DC

## 2021-06-29 MED ORDER — BUTORPHANOL TARTRATE 1 MG/ML IJ SOLN
1.0000 mg | Freq: Once | INTRAMUSCULAR | Status: AC
  Administered 2021-06-29: 1 mg via INTRAVENOUS
  Filled 2021-06-29: qty 1

## 2021-06-29 MED ORDER — MISOPROSTOL 50MCG HALF TABLET
50.0000 ug | ORAL_TABLET | ORAL | Status: DC | PRN
  Administered 2021-06-29: 50 ug via BUCCAL
  Filled 2021-06-29: qty 1

## 2021-06-29 MED ORDER — FENTANYL-BUPIVACAINE-NACL 0.5-0.125-0.9 MG/250ML-% EP SOLN
12.0000 mL/h | EPIDURAL | Status: DC | PRN
  Filled 2021-06-29: qty 250

## 2021-06-29 MED ORDER — PHENYLEPHRINE 80 MCG/ML (10ML) SYRINGE FOR IV PUSH (FOR BLOOD PRESSURE SUPPORT)
80.0000 ug | PREFILLED_SYRINGE | INTRAVENOUS | Status: DC | PRN
  Administered 2021-06-30 (×2): 80 ug via INTRAVENOUS
  Filled 2021-06-29: qty 10

## 2021-06-29 MED ORDER — DIPHENHYDRAMINE HCL 50 MG/ML IJ SOLN: 12.5000 mg | INTRAMUSCULAR | Status: DC | PRN

## 2021-06-29 MED ORDER — MISOPROSTOL 25 MCG QUARTER TABLET
25.0000 ug | ORAL_TABLET | ORAL | Status: DC | PRN
  Administered 2021-06-29: 25 ug via VAGINAL
  Filled 2021-06-29: qty 1

## 2021-06-29 NOTE — Progress Notes (Signed)
Paige Young is a 26 y.o. G1P0 at [redacted]w[redacted]d admitted for induction of labor due to suspected FGR (2.5%ile). ? ?Subjective: ?Patient reports doing ok. Pain is managed with intermittent IV Fentanyl ? ? ?Objective: ?BP 107/61   Pulse 62   Temp 97.6 ?F (36.4 ?C) (Axillary)   Resp 16   Ht 5\' 2"  (1.575 m)   Wt 64.1 kg   LMP 10/05/2020 (Exact Date)   SpO2 97%   BMI 25.84 kg/m?  ?No intake/output data recorded. ?No intake/output data recorded. ? ?FHT:  FHR: 140 bpm, variability: moderate,  accelerations:  Present,  decelerations:  Absent ?UC:   regular, every 3-5 minutes ?SVE:   Dilation: 1.5 ?Effacement (%): 50 ?Station: -3 ?Exam by:: Dr. Cy Blamer ? ?Labs: ?Lab Results  ?Component Value Date  ? WBC 8.9 06/28/2021  ? HGB 13.2 06/28/2021  ? HCT 38.1 06/28/2021  ? MCV 91.1 06/28/2021  ? PLT 257 06/28/2021  ? ? ?Assessment / Plan: ?G1P0 at [redacted]w[redacted]d admitted for induction of labor due to suspected FGR (2.5%ile). ? ?Labor:  cervix continues to be 1.5 cm/50%/and posterior to maternal left. We discussed again about recommendation for foley balloon to help facilitate cervical ripening. Discussed that we are now getting to cytotec #5 and still in early ripening. FB thought to help facilitate further ripening which anticipate would lead to active phase of IOL sooner. Additionally have not seen much change with cytotec and therefore might need additional agent such as FB to make change. Patient expressed understanding but  at this time states she would only consider it "as a last resort". We discussed that since we are getting to almost 6 doses of cytotec we might be getting to that last resort stage. Patient reports would contemplate further.  ? ?At that time discussed doing a vaginal cytotec instead since so far has only tried buccal cytotec. Patient would like to continue buccal cytotec at this time. ? ?Fetal Wellbeing:  Category I ?Pain Control:  IV pain meds ?I/D:   GBS negative ? ? ?Renard Matter, MD, MPH ?OB Fellow, Faculty  Practice ? ? ?

## 2021-06-29 NOTE — Progress Notes (Signed)
Pt had emesis.  Emesis strained.  No Cytotec found ?

## 2021-06-29 NOTE — Progress Notes (Signed)
IV saline lock.  Pt OOB to wash up in bathroom and birthing ball at bedside for pt to use ?

## 2021-06-29 NOTE — Progress Notes (Signed)
Attempting to apply f & m monitor on abdomen to trace fhr and uc's ?

## 2021-06-29 NOTE — Progress Notes (Addendum)
Labor Progress Note ?DENYM Young is a 26 y.o. G1P0 at [redacted]w[redacted]d presented for IOL 2/2 to IUGR ? ?S: Patient has transitioned to hands and knees to help with her lower back pain.  She would not like to be checked as she feels she did not get any benefit from the buccal cytotec as she threw it up about 30 minutes after taking it.  Patients pain is a little more tolerable, did not feel that the stadol helped at all.  She is considering an early epidural at this point but would like to try the new repositioning and to eat something. ? ?O:  ?BP 113/69 (BP Location: Right Arm)   Pulse (!) 55   Temp 98.3 ?F (36.8 ?C) (Oral)   Resp 18   Ht 5\' 2"  (1.575 m)   Wt 64.1 kg   LMP 10/05/2020 (Exact Date)   SpO2 97%   BMI 25.84 kg/m?  ?EFM: baseline 140/moderate variability/+accels, no decels  ? ?CVE: Dilation: 1.5 ?Effacement (%): 70 ?Cervical Position: Posterior ?Station: -3 ?Presentation: Vertex ?Exam by:: Dr Higinio Plan ? ? ?A&P: 26 y.o. G1P0 [redacted]w[redacted]d  ?#Labor: Patient declined check.  Due to likelihood that minimal buccal cytotec being absorbed at the last dose was actually not metabolized due to patient confidently stating that she threw it up had a conversation with patient and she would like to forego a check. We offered and then confirmed vertex positioning of baby on BSUS and plan to do another buccal cytotec  ?#Pain: PRN, discussed option of an early epidural ?#FWB: Cat 1 ?#GBS negative ? ? ?Noralee Stain, DO, PGY-1 ?8:51 PM  ? ?GME ATTESTATION:  ?I saw and evaluated the patient. I agree with the findings and the plan of care as documented in the resident?s note and have made all necessary edits. ? ?Renard Matter, MD, MPH ?OB Fellow, Faculty Practice ?[place] [facility] ?06/30/2021 3:27 AM ? ?

## 2021-06-29 NOTE — Progress Notes (Signed)
From 1012 until present Dr Annia Friendly assisted pt with exercises/stretches  ?

## 2021-06-29 NOTE — Progress Notes (Signed)
Paige Young is a 26 y.o. G1P0 at [redacted]w[redacted]d admitted for induction of labor due to suspected FGR (2.5%ile). ?  ?Patient feeling some cramping that is relieved with IV fentanyl ? ?Cervix checked by RN while I was in a delivery. Continues to be ~1.5cm, effacement progressed to 70%. ? ?Tracing reviewed: ?Baseline 150 with moderate variability, accels, and no decels ? ?Given cytotec #4 (buccally). Patient continues to decline foley balloon at this time. ? ?Discussed that at next check if continues to similar exam would further discuss foley balloon as could help facilitate ripening further. Patient expressed understanding ? ?Warner Mccreedy, MD, MPH ?OB Fellow, Faculty Practice ?  ?

## 2021-06-29 NOTE — Progress Notes (Signed)
M & F monitor pads loose after pt washed up in bathroom.  Old pods removed and fresh pod applied ?

## 2021-06-29 NOTE — Progress Notes (Signed)
Labor Progress Note ?Paige Young is a 26 y.o. G1P0 at [redacted]w[redacted]d presented for IOL due to FGR.  ? ?S: Patient reports back pain on a frequent basis but feeling contractions about every 30 minutes or so.  ? ?O:  ?BP 126/84   Pulse (!) 58   Temp 97.6 ?F (36.4 ?C) (Oral)   Resp 18   Ht 5\' 2"  (1.575 m)   Wt 64.1 kg   LMP 10/05/2020 (Exact Date)   SpO2 97%   BMI 25.84 kg/m?  ?EFM: 140/mod/15x15/none  ? ?CVE: Dilation: 1.5 ?Effacement (%): 60 ?Cervical Position: Posterior ?Station: -3 ?Presentation: Vertex ?Exam by:: Dr [email or web address] ? ? ?A&P: 26 y.o. G1P0 [redacted]w[redacted]d  ?#Labor: Largely unchanged from previous, but suspect it is a little softer than before. Cytotec placed vaginally (6), the previous 5 have been buccal. Spent approximately 30 minutes with patient doing pelvic stretches (psoas, piriformis, hamstrings etc) and side lying release bilaterally with jiggle to help prepare for labor. Re-discussed FB which she may consider in the future but not interested in it now.   ? ?#Pain: PRN, will try stadol instead of fentanyl since she has used numerous doses at this point  ?#FWB: Cat I  ?#GBS negative ? ?[redacted]w[redacted]d, DO ?

## 2021-06-29 NOTE — Progress Notes (Signed)
Labor Progress Note ?[name] is a 26 y.o. G1P0 at [redacted]w[redacted]d presented for IOL due to FGR.  ? ?S: Still having a lot of constant low back pain that radiates into her right hip most of the time. Gotten up and back to the restroom several times but movement doesn't seem to help too much.  ? ?O:  ?BP 117/70   Pulse (!) 57   Temp 98 ?F (36.7 ?C) (Oral)   Resp 20   Ht 5\' 2"  (1.575 m)   Wt 64.1 kg   LMP 10/05/2020 (Exact Date)   SpO2 97%   BMI 25.84 kg/m?  ?EFM: 140/mod/15x15/none ? ?CVE: Dilation: 1.5 ?Effacement (%): 70 ?Cervical Position: Posterior ?Station: -3 ?Presentation: Vertex ?Exam by:: Dr [email or web address] ? ? ?A&P: 26 y.o. G1P0 [redacted]w[redacted]d  ?#Labor: Some softening felt but still very posterior and minimally dilated. Plan for an additional buccal cytotec (#7) and possibly transitioning to pit +/- FB pending next check. She is only interested in the FB if her epidural is in place. Discussed that she has used fentanyl numerous times now and stadol (which didn't help) and would likely benefit from early epidural as an option. She is planning for one additional dose of IV fent and then transitioning to epidural.  ?#Pain: See above  ?#FWB: Cat I  ?#GBS negative ? ? ?[redacted]w[redacted]d, DO ?4:28 PM  ?

## 2021-06-29 NOTE — Progress Notes (Signed)
Pt has been moving ad lib.  Requested that pt stay on left side.  Pt informed baby is having dips in FHR  IV bolus started ?

## 2021-06-30 ENCOUNTER — Encounter (HOSPITAL_COMMUNITY)
Admission: AD | Disposition: A | Discharge status: Home / Self Care | Payer: Self-pay | Source: Home / Self Care | Attending: Obstetrics & Gynecology

## 2021-06-30 ENCOUNTER — Encounter (HOSPITAL_COMMUNITY): Payer: Self-pay | Admitting: Obstetrics and Gynecology

## 2021-06-30 ENCOUNTER — Inpatient Hospital Stay (HOSPITAL_COMMUNITY): Payer: Medicaid Other | Admitting: Anesthesiology

## 2021-06-30 DIAGNOSIS — Z3A38 38 weeks gestation of pregnancy: Secondary | ICD-10-CM

## 2021-06-30 DIAGNOSIS — O365930 Maternal care for other known or suspected poor fetal growth, third trimester, not applicable or unspecified: Secondary | ICD-10-CM

## 2021-06-30 SURGERY — Surgical Case
Anesthesia: Epidural | Site: Abdomen | Wound class: Clean Contaminated

## 2021-06-30 MED ORDER — ONDANSETRON HCL 4 MG/2ML IJ SOLN: 4.0000 mg | Freq: Once | INTRAMUSCULAR | Status: DC | PRN

## 2021-06-30 MED ORDER — LIDOCAINE-EPINEPHRINE (PF) 2 %-1:200000 IJ SOLN
INTRAMUSCULAR | Status: DC | PRN
  Administered 2021-06-30: 10 mL via EPIDURAL

## 2021-06-30 MED ORDER — ALBUTEROL SULFATE (2.5 MG/3ML) 0.083% IN NEBU: 3.0000 mL | INHALATION_SOLUTION | Freq: Four times a day (QID) | RESPIRATORY_TRACT | Status: DC | PRN

## 2021-06-30 MED ORDER — LACTATED RINGERS IV SOLN: INTRAVENOUS | Status: DC

## 2021-06-30 MED ORDER — DIPHENHYDRAMINE HCL 50 MG/ML IJ SOLN
12.5000 mg | INTRAMUSCULAR | Status: DC | PRN
Start: 2021-06-30 — End: 2021-07-02

## 2021-06-30 MED ORDER — MORPHINE SULFATE (PF) 0.5 MG/ML IJ SOLN
INTRAMUSCULAR | Status: AC
  Filled 2021-06-30: qty 10

## 2021-06-30 MED ORDER — LACTATED RINGERS IV SOLN: 500.0000 mL | Freq: Once | INTRAVENOUS | Status: DC

## 2021-06-30 MED ORDER — MENTHOL 3 MG MT LOZG: 1.0000 | LOZENGE | OROMUCOSAL | Status: DC | PRN

## 2021-06-30 MED ORDER — ACETAMINOPHEN 10 MG/ML IV SOLN
INTRAVENOUS | Status: DC | PRN
  Administered 2021-06-30: 1000 mg via INTRAVENOUS

## 2021-06-30 MED ORDER — LACTATED RINGERS IV SOLN: INTRAVENOUS | Status: DC | PRN

## 2021-06-30 MED ORDER — SODIUM CHLORIDE 0.9% FLUSH: 3.0000 mL | INTRAVENOUS | Status: DC | PRN

## 2021-06-30 MED ORDER — FENTANYL CITRATE (PF) 100 MCG/2ML IJ SOLN
INTRAMUSCULAR | Status: DC | PRN
Start: 2021-06-30 — End: 2021-06-30
  Administered 2021-06-30: 100 ug via EPIDURAL

## 2021-06-30 MED ORDER — STERILE WATER FOR IRRIGATION IR SOLN
Status: DC | PRN
  Administered 2021-06-30: 1

## 2021-06-30 MED ORDER — DIPHENHYDRAMINE HCL 25 MG PO CAPS: 25.0000 mg | ORAL_CAPSULE | Freq: Four times a day (QID) | ORAL | Status: DC | PRN

## 2021-06-30 MED ORDER — SIMETHICONE 80 MG PO CHEW: 80.0000 mg | CHEWABLE_TABLET | ORAL | Status: DC | PRN

## 2021-06-30 MED ORDER — NALOXONE HCL 4 MG/10ML IJ SOLN
1.0000 ug/kg/h | INTRAVENOUS | Status: DC | PRN
  Filled 2021-06-30: qty 5

## 2021-06-30 MED ORDER — ACETAMINOPHEN 500 MG PO TABS: 1000.0000 mg | ORAL_TABLET | Freq: Four times a day (QID) | ORAL | Status: DC

## 2021-06-30 MED ORDER — SCOPOLAMINE 1 MG/3DAYS TD PT72
MEDICATED_PATCH | TRANSDERMAL | Status: AC
  Filled 2021-06-30: qty 1

## 2021-06-30 MED ORDER — ONDANSETRON HCL 4 MG/2ML IJ SOLN
INTRAMUSCULAR | Status: DC | PRN
Start: 2021-06-30 — End: 2021-06-30
  Administered 2021-06-30: 4 mg via INTRAVENOUS

## 2021-06-30 MED ORDER — KETOROLAC TROMETHAMINE 30 MG/ML IJ SOLN: 30.0000 mg | Freq: Four times a day (QID) | INTRAMUSCULAR | Status: AC | PRN

## 2021-06-30 MED ORDER — AMISULPRIDE (ANTIEMETIC) 5 MG/2ML IV SOLN
10.0000 mg | Freq: Once | INTRAVENOUS | Status: DC | PRN
  Filled 2021-06-30: qty 4

## 2021-06-30 MED ORDER — FENTANYL CITRATE (PF) 100 MCG/2ML IJ SOLN
INTRAMUSCULAR | Status: AC
Start: 2021-06-30 — End: ?
  Filled 2021-06-30: qty 2

## 2021-06-30 MED ORDER — OXYCODONE-ACETAMINOPHEN 5-325 MG PO TABS: 2.0000 | ORAL_TABLET | ORAL | Status: DC | PRN

## 2021-06-30 MED ORDER — DIPHENHYDRAMINE HCL 25 MG PO CAPS: 25.0000 mg | ORAL_CAPSULE | ORAL | Status: DC | PRN

## 2021-06-30 MED ORDER — MEPERIDINE HCL 25 MG/ML IJ SOLN: 6.2500 mg | INTRAMUSCULAR | Status: DC | PRN

## 2021-06-30 MED ORDER — ACETAMINOPHEN 10 MG/ML IV SOLN
INTRAVENOUS | Status: AC
  Filled 2021-06-30: qty 100

## 2021-06-30 MED ORDER — LIDOCAINE-EPINEPHRINE (PF) 2 %-1:200000 IJ SOLN
INTRAMUSCULAR | Status: AC
  Filled 2021-06-30: qty 20

## 2021-06-30 MED ORDER — MIDAZOLAM HCL 2 MG/2ML IJ SOLN
INTRAMUSCULAR | Status: DC | PRN
  Administered 2021-06-30: 2 mg via INTRAVENOUS

## 2021-06-30 MED ORDER — LIDOCAINE HCL (PF) 1 % IJ SOLN
INTRAMUSCULAR | Status: DC | PRN
  Administered 2021-06-30: 10 mL via EPIDURAL
  Administered 2021-06-30: 2 mL via EPIDURAL

## 2021-06-30 MED ORDER — CEFAZOLIN SODIUM-DEXTROSE 2-4 GM/100ML-% IV SOLN
2.0000 g | Freq: Once | INTRAVENOUS | Status: AC
  Administered 2021-06-30: 2 g via INTRAVENOUS

## 2021-06-30 MED ORDER — DIBUCAINE (PERIANAL) 1 % EX OINT: 1.0000 "application " | TOPICAL_OINTMENT | CUTANEOUS | Status: DC | PRN

## 2021-06-30 MED ORDER — KETOROLAC TROMETHAMINE 30 MG/ML IJ SOLN
30.0000 mg | Freq: Once | INTRAMUSCULAR | Status: AC | PRN
  Administered 2021-06-30: 30 mg via INTRAVENOUS

## 2021-06-30 MED ORDER — FENTANYL-BUPIVACAINE-NACL 0.5-0.125-0.9 MG/250ML-% EP SOLN
EPIDURAL | Status: DC | PRN
  Administered 2021-06-30: 12 mL/h via EPIDURAL

## 2021-06-30 MED ORDER — DEXMEDETOMIDINE (PRECEDEX) IN NS 20 MCG/5ML (4 MCG/ML) IV SYRINGE
PREFILLED_SYRINGE | INTRAVENOUS | Status: DC | PRN
  Administered 2021-06-30: 20 ug via INTRAVENOUS

## 2021-06-30 MED ORDER — KETOROLAC TROMETHAMINE 30 MG/ML IJ SOLN
30.0000 mg | Freq: Four times a day (QID) | INTRAMUSCULAR | Status: AC
  Administered 2021-06-30 – 2021-07-01 (×4): 30 mg via INTRAVENOUS
  Filled 2021-06-30 (×5): qty 1

## 2021-06-30 MED ORDER — ONDANSETRON HCL 4 MG/2ML IJ SOLN: 4.0000 mg | Freq: Three times a day (TID) | INTRAMUSCULAR | Status: DC | PRN

## 2021-06-30 MED ORDER — ACETAMINOPHEN 500 MG PO TABS
1000.0000 mg | ORAL_TABLET | Freq: Four times a day (QID) | ORAL | Status: DC
  Administered 2021-06-30 – 2021-07-02 (×8): 1000 mg via ORAL
  Filled 2021-06-30 (×10): qty 2

## 2021-06-30 MED ORDER — MEASLES, MUMPS & RUBELLA VAC IJ SOLR: 0.5000 mL | Freq: Once | INTRAMUSCULAR | Status: DC

## 2021-06-30 MED ORDER — PRENATAL MULTIVITAMIN CH
1.0000 | ORAL_TABLET | Freq: Every day | ORAL | Status: DC
  Administered 2021-06-30 – 2021-07-02 (×3): 1 via ORAL
  Filled 2021-06-30 (×3): qty 1

## 2021-06-30 MED ORDER — OXYTOCIN-SODIUM CHLORIDE 30-0.9 UT/500ML-% IV SOLN
2.5000 [IU]/h | INTRAVENOUS | Status: AC
  Administered 2021-06-30: 2.5 [IU]/h via INTRAVENOUS
  Filled 2021-06-30: qty 500

## 2021-06-30 MED ORDER — IBUPROFEN 600 MG PO TABS
600.0000 mg | ORAL_TABLET | Freq: Four times a day (QID) | ORAL | Status: DC
  Administered 2021-07-01 – 2021-07-02 (×5): 600 mg via ORAL
  Filled 2021-06-30 (×5): qty 1

## 2021-06-30 MED ORDER — FENTANYL CITRATE (PF) 100 MCG/2ML IJ SOLN
INTRAMUSCULAR | Status: DC | PRN
  Administered 2021-06-30 (×2): 50 ug via INTRAVENOUS

## 2021-06-30 MED ORDER — PHENYLEPHRINE 80 MCG/ML (10ML) SYRINGE FOR IV PUSH (FOR BLOOD PRESSURE SUPPORT): 80.0000 ug | PREFILLED_SYRINGE | INTRAVENOUS | Status: DC | PRN

## 2021-06-30 MED ORDER — KETOROLAC TROMETHAMINE 30 MG/ML IJ SOLN
INTRAMUSCULAR | Status: AC
  Filled 2021-06-30: qty 1

## 2021-06-30 MED ORDER — ENOXAPARIN SODIUM 40 MG/0.4ML IJ SOSY
40.0000 mg | PREFILLED_SYRINGE | INTRAMUSCULAR | Status: DC
  Administered 2021-07-01 – 2021-07-02 (×2): 40 mg via SUBCUTANEOUS
  Filled 2021-06-30 (×2): qty 0.4

## 2021-06-30 MED ORDER — HYDROMORPHONE HCL 1 MG/ML IJ SOLN: 1.0000 mg | INTRAMUSCULAR | Status: DC | PRN

## 2021-06-30 MED ORDER — DIPHENHYDRAMINE HCL 50 MG/ML IJ SOLN: 12.5000 mg | INTRAMUSCULAR | Status: DC | PRN

## 2021-06-30 MED ORDER — OXYCODONE HCL 5 MG PO TABS: 5.0000 mg | ORAL_TABLET | Freq: Once | ORAL | Status: DC | PRN

## 2021-06-30 MED ORDER — MORPHINE SULFATE (PF) 0.5 MG/ML IJ SOLN
INTRAMUSCULAR | Status: DC | PRN
  Administered 2021-06-30: 3 mg via EPIDURAL

## 2021-06-30 MED ORDER — FENTANYL-BUPIVACAINE-NACL 0.5-0.125-0.9 MG/250ML-% EP SOLN: 12.0000 mL/h | EPIDURAL | Status: DC | PRN

## 2021-06-30 MED ORDER — OXYCODONE HCL 5 MG/5ML PO SOLN: 5.0000 mg | Freq: Once | ORAL | Status: DC | PRN

## 2021-06-30 MED ORDER — OXYTOCIN-SODIUM CHLORIDE 30-0.9 UT/500ML-% IV SOLN
INTRAVENOUS | Status: AC
  Filled 2021-06-30: qty 500

## 2021-06-30 MED ORDER — GABAPENTIN 300 MG PO CAPS
300.0000 mg | ORAL_CAPSULE | Freq: Two times a day (BID) | ORAL | Status: DC
  Administered 2021-06-30 – 2021-07-02 (×5): 300 mg via ORAL
  Filled 2021-06-30 (×5): qty 1

## 2021-06-30 MED ORDER — OXYTOCIN-SODIUM CHLORIDE 30-0.9 UT/500ML-% IV SOLN
INTRAVENOUS | Status: DC | PRN
  Administered 2021-06-30: 400 mL via INTRAVENOUS

## 2021-06-30 MED ORDER — DEXAMETHASONE SODIUM PHOSPHATE 4 MG/ML IJ SOLN
INTRAMUSCULAR | Status: DC | PRN
  Administered 2021-06-30: 4 mg via INTRAVENOUS

## 2021-06-30 MED ORDER — WITCH HAZEL-GLYCERIN EX PADS: 1.0000 "application " | MEDICATED_PAD | CUTANEOUS | Status: DC | PRN

## 2021-06-30 MED ORDER — MIDAZOLAM HCL 2 MG/2ML IJ SOLN
INTRAMUSCULAR | Status: AC
  Filled 2021-06-30: qty 2

## 2021-06-30 MED ORDER — TETANUS-DIPHTH-ACELL PERTUSSIS 5-2.5-18.5 LF-MCG/0.5 IM SUSY: 0.5000 mL | PREFILLED_SYRINGE | Freq: Once | INTRAMUSCULAR | Status: DC

## 2021-06-30 MED ORDER — COCONUT OIL OIL: 1.0000 "application " | TOPICAL_OIL | Status: DC | PRN

## 2021-06-30 MED ORDER — SCOPOLAMINE 1 MG/3DAYS TD PT72
1.0000 | MEDICATED_PATCH | Freq: Once | TRANSDERMAL | Status: DC
  Administered 2021-06-30: 1.5 mg via TRANSDERMAL

## 2021-06-30 MED ORDER — FERROUS SULFATE 325 (65 FE) MG PO TABS
325.0000 mg | ORAL_TABLET | ORAL | Status: DC
  Administered 2021-07-01: 325 mg via ORAL
  Filled 2021-06-30: qty 1

## 2021-06-30 MED ORDER — DEXAMETHASONE SODIUM PHOSPHATE 4 MG/ML IJ SOLN
INTRAMUSCULAR | Status: AC
Start: 2021-06-30 — End: ?
  Filled 2021-06-30: qty 1

## 2021-06-30 MED ORDER — ONDANSETRON HCL 4 MG/2ML IJ SOLN
INTRAMUSCULAR | Status: AC
  Filled 2021-06-30: qty 2

## 2021-06-30 MED ORDER — ZOLPIDEM TARTRATE 5 MG PO TABS: 5.0000 mg | ORAL_TABLET | Freq: Every evening | ORAL | Status: DC | PRN

## 2021-06-30 MED ORDER — MAGNESIUM HYDROXIDE 400 MG/5ML PO SUSP: 30.0000 mL | ORAL | Status: DC | PRN

## 2021-06-30 MED ORDER — SENNOSIDES-DOCUSATE SODIUM 8.6-50 MG PO TABS
2.0000 | ORAL_TABLET | Freq: Every day | ORAL | Status: DC
  Administered 2021-07-01 – 2021-07-02 (×2): 2 via ORAL
  Filled 2021-06-30 (×2): qty 2

## 2021-06-30 MED ORDER — OXYCODONE HCL 5 MG PO TABS: 5.0000 mg | ORAL_TABLET | ORAL | Status: DC | PRN

## 2021-06-30 MED ORDER — NALOXONE HCL 0.4 MG/ML IJ SOLN: 0.4000 mg | INTRAMUSCULAR | Status: DC | PRN

## 2021-06-30 MED ORDER — HYDROMORPHONE HCL 1 MG/ML IJ SOLN: 0.2500 mg | INTRAMUSCULAR | Status: DC | PRN

## 2021-06-30 SURGICAL SUPPLY — 31 items
CHLORAPREP W/TINT 26ML (MISCELLANEOUS) ×4 IMPLANT
CLAMP CORD UMBIL (MISCELLANEOUS) ×2 IMPLANT
CLOTH BEACON ORANGE TIMEOUT ST (SAFETY) ×2 IMPLANT
DRSG OPSITE POSTOP 4X10 (GAUZE/BANDAGES/DRESSINGS) ×2 IMPLANT
ELECT REM PT RETURN 9FT ADLT (ELECTROSURGICAL)
ELECTRODE REM PT RTRN 9FT ADLT (ELECTROSURGICAL) ×1 IMPLANT
EXTRACTOR VACUUM M CUP 4 TUBE (SUCTIONS) IMPLANT
GAUZE PAD ABD 7.5X8 STRL (GAUZE/BANDAGES/DRESSINGS) ×1 IMPLANT
GAUZE SPONGE 4X4 12PLY STRL LF (GAUZE/BANDAGES/DRESSINGS) ×2 IMPLANT
GLOVE BIOGEL PI IND STRL 7.0 (GLOVE) ×3 IMPLANT
GLOVE BIOGEL PI INDICATOR 7.0 (GLOVE) ×3
GLOVE ECLIPSE 7.0 STRL STRAW (GLOVE) ×2 IMPLANT
GOWN STRL REUS W/TWL LRG LVL3 (GOWN DISPOSABLE) ×4 IMPLANT
KIT ABG SYR 3ML LUER SLIP (SYRINGE) IMPLANT
NDL HYPO 25X5/8 SAFETYGLIDE (NEEDLE) ×1 IMPLANT
NEEDLE HYPO 22GX1.5 SAFETY (NEEDLE) ×2 IMPLANT
NEEDLE HYPO 25X5/8 SAFETYGLIDE (NEEDLE) ×2 IMPLANT
NS IRRIG 1000ML POUR BTL (IV SOLUTION) ×2 IMPLANT
PACK C SECTION WH (CUSTOM PROCEDURE TRAY) ×2 IMPLANT
PAD ABD 7.5X8 STRL (GAUZE/BANDAGES/DRESSINGS) ×2 IMPLANT
PAD OB MATERNITY 4.3X12.25 (PERSONAL CARE ITEMS) ×2 IMPLANT
RTRCTR C-SECT PINK 25CM LRG (MISCELLANEOUS) IMPLANT
SUT PDS AB 0 CTX 36 PDP370T (SUTURE) IMPLANT
SUT PLAIN 2 0 XLH (SUTURE) IMPLANT
SUT VIC AB 0 CTX 36 (SUTURE) ×4
SUT VIC AB 0 CTX36XBRD ANBCTRL (SUTURE) ×2 IMPLANT
SUT VIC AB 4-0 KS 27 (SUTURE) ×2 IMPLANT
SYR CONTROL 10ML LL (SYRINGE) ×2 IMPLANT
TOWEL OR 17X24 6PK STRL BLUE (TOWEL DISPOSABLE) ×2 IMPLANT
TRAY FOLEY W/BAG SLVR 14FR LF (SET/KITS/TRAYS/PACK) ×2 IMPLANT
WATER STERILE IRR 1000ML POUR (IV SOLUTION) ×2 IMPLANT

## 2021-06-30 NOTE — Progress Notes (Signed)
Labor Progress Note ?Paige Young is a 26 y.o. G1P0 at [redacted]w[redacted]d presented for IOL 2/2 to IUGR ? ?S: Patient is comfortable with epidural ? ?O:  ?BP (!) 98/54   Pulse (!) 59   Temp 98.3 ?F (36.8 ?C) (Oral)   Resp 18   Ht 5\' 2"  (1.575 m)   Wt 64.1 kg   LMP 10/05/2020 (Exact Date)   SpO2 99%   BMI 25.84 kg/m?  ?EFM: baseline 140/moderate variability/+accels, repetitive late and variable decels  ?Toco: q2-3 mins ? ?CVE: Dilation: 1.5 ?Effacement (%): 60 ?Cervical Position: Posterior ?Station: -3 ?Presentation: Vertex ?Exam by:: Dr. [email or web address] ? ? ?A&P: 26 y.o. G1P0 [redacted]w[redacted]d  ?Recurrent Category II FHR tracing and fetal intolerance of labor remote from vaginal delivery.  Terbutaline given. Given these indications, cesarean section is recommended.  The risks of surgery were discussed with the patient including but were not limited to: bleeding which may require transfusion or reoperation; infection which may require antibiotics; injury to bowel, bladder, ureters or other surrounding organs; injury to the fetus; need for additional procedures including hysterectomy in the event of a life-threatening hemorrhage; formation of adhesions; placental abnormalities with subsequent pregnancies; incisional problems; thromboembolic phenomenon and other postoperative/anesthesia complications.  Patient feels that this is all rushed, wants some minutes to think about this. ?FHR temporarily reassuring after terbutaline, patient cautioned about possible need for emergent cesarean section. ?We will come back and discuss in a few minutes ? ? ?[redacted]w[redacted]d, MD, FACOG ?Obstetrician Paige Young, Faculty Practice ?Center for [facility] manager, [facility] ? ? ?

## 2021-06-30 NOTE — Progress Notes (Signed)
Delayed documentation due to floor acuity ? ?Called to patient bedside as having prolonged decelerations. Initially low BP after epidural. Received phenylephrine and BP now improved. ? ?Had a series of late decels and two 4 in prolonged decles to 60. Cervix unchanged at 1.5 cm.  ? ?Decel eventually resolved after fluid bolus, phenylephrine and position change. Discussed holding off on FB at this time given decels and FB could cause distress in fetus with increased contractions. ? ?Called to another room for delivery. Plan to reassess tracing and patient status after. If fetal status reassuring will discuss FB at that time. If continues to be non-reassuring would further discuss cesarean delivery. ? ? ?Warner Mccreedy, MD, MPH ?OB Fellow, Faculty Practice ? ?

## 2021-06-30 NOTE — Progress Notes (Signed)
FHR tracing with baseline of 140s, moderate variability, no acceleration, no decelerations since terbutaline administration. Irregular contractions noted. ?Patient is still considering if she will go for recommended cesarean delivery, questions are still being answered.  Will continue close observation. ? ? ?Jaynie Collins, MD, FACOG ?Obstetrician Heritage manager, Faculty Practice ?Center for [employer], [facility] ? ?

## 2021-06-30 NOTE — Anesthesia Procedure Notes (Signed)
Epidural ?Patient location during procedure: OB ?Start time: 06/30/2021 12:49 AM ?End time: 06/30/2021 12:58 AM ? ?Staffing ?Anesthesiologist: Pervis Hocking, DO ?Performed: anesthesiologist  ? ?Preanesthetic Checklist ?Completed: patient identified, IV checked, risks and benefits discussed, monitors and equipment checked, pre-op evaluation and timeout performed ? ?Epidural ?Patient position: sitting ?Prep: DuraPrep and site prepped and draped ?Patient monitoring: continuous pulse ox, blood pressure, heart rate and cardiac monitor ?Approach: midline ?Location: L3-L4 ?Injection technique: LOR air ? ?Needle:  ?Needle type: Tuohy  ?Needle gauge: 17 G ?Needle length: 9 cm ?Needle insertion depth: 5 cm ?Catheter type: closed end flexible ?Catheter size: 19 Gauge ?Catheter at skin depth: 10 cm ?Test dose: negative ? ?Assessment ?Sensory level: T8 ?Events: blood not aspirated, injection not painful, no injection resistance, no paresthesia and negative IV test ? ?Additional Notes ?Patient identified. Risks/Benefits/Options discussed with patient including but not limited to bleeding, infection, nerve damage, paralysis, failed block, incomplete pain control, headache, blood pressure changes, nausea, vomiting, reactions to medication both or allergic, itching and postpartum back pain. Confirmed with bedside nurse the patient's most recent platelet count. Confirmed with patient that they are not currently taking any anticoagulation, have any bleeding history or any family history of bleeding disorders. Patient expressed understanding and wished to proceed. All questions were answered. Sterile technique was used throughout the entire procedure. Please see nursing notes for vital signs. Test dose was given through epidural catheter and negative prior to continuing to dose epidural or start infusion. Warning signs of high block given to the patient including shortness of breath, tingling/numbness in hands, complete motor  block, or any concerning symptoms with instructions to call for help. Patient was given instructions on fall risk and not to get out of bed. All questions and concerns addressed with instructions to call with any issues or inadequate analgesia.  Reason for block:procedure for pain ? ? ? ?

## 2021-06-30 NOTE — Anesthesia Postprocedure Evaluation (Signed)
Anesthesia Post Note ? ?[name] ? ?Procedure(s) Performed: CESAREAN SECTION (Abdomen) ? ?  ? ?Patient location during evaluation: PACU ?Anesthesia Type: Epidural ?Level of consciousness: oriented and awake and alert ?Pain management: pain level controlled ?Vital Signs Assessment: post-procedure vital signs reviewed and stable ?Respiratory status: spontaneous breathing, respiratory function stable and nonlabored ventilation ?Cardiovascular status: blood pressure returned to baseline and stable ?Postop Assessment: no headache, no backache, no apparent nausea or vomiting, epidural receding and patient able to bend at knees ?Anesthetic complications: no ? ? ?No notable events documented. ? ?Last Vitals:  ?Vitals:  ? 06/30/21 0745 06/30/21 0800  ?BP: 98/68 (!) 90/40  ?Pulse: 68 65  ?Resp: 16 18  ?Temp: (!) 36.1 ?C   ?SpO2: 100% 100%  ?  ?Last Pain:  ?Vitals:  ? 06/30/21 0800  ?TempSrc:   ?PainSc: 0-No pain  ? ?Pain Goal: Patients Stated Pain Goal: 0 (06/30/21 0115) ? ?LLE Motor Response: Purposeful movement (06/30/21 0800) ?  ?RLE Motor Response: Purposeful movement (06/30/21 0800) ?  ?  ?  ?Epidural/Spinal Function Cutaneous sensation: Tingles (06/30/21 0745), Patient able to flex knees: Yes (06/30/21 0745), Patient able to lift hips off bed: No (06/30/21 0745), Back pain beyond tenderness at insertion site: No (06/30/21 0745), Progressively worsening motor and/or sensory loss: No (06/30/21 0745), Bowel and/or bladder incontinence post epidural: No (06/30/21 0745) ? ?Luzmaria Devaux A. ? ? ? ? ?

## 2021-06-30 NOTE — Anesthesia Preprocedure Evaluation (Signed)
Anesthesia Evaluation  ?Patient identified by MRN, date of birth, ID band ?Patient awake ? ? ? ?Reviewed: ?Allergy & Precautions, Patient's Chart, lab work & pertinent test results ? ?Airway ?Mallampati: II ? ?TM Distance: >3 FB ?Neck ROM: Full ? ? ? Dental ?no notable dental hx. ? ?  ?Pulmonary ?asthma , Current Smoker,  ?  ?Pulmonary exam normal ?breath sounds clear to auscultation ? ? ? ? ? ? Cardiovascular ?negative cardio ROS ?Normal cardiovascular exam ?Rhythm:Regular Rate:Normal ? ? ?  ?Neuro/Psych ?PSYCHIATRIC DISORDERS Depression negative neurological ROS ?   ? GI/Hepatic ?negative GI ROS, Neg liver ROS,   ?Endo/Other  ?negative endocrine ROS ? Renal/GU ?negative Renal ROS  ?negative genitourinary ?  ?Musculoskeletal ?negative musculoskeletal ROS ?(+)  ? Abdominal ?  ?Peds ?negative pediatric ROS ?(+)  Hematology ?negative hematology ROS ?(+) hct 38.1, plt 257   ?Anesthesia Other Findings ? ? Reproductive/Obstetrics ?(+) Pregnancy ? ?  ? ? ? ? ? ? ? ? ? ? ? ? ? ?  ?  ? ? ? ? ? ? ? ? ?Anesthesia Physical ?Anesthesia Plan ? ?ASA: 2 ? ?Anesthesia Plan: Epidural  ? ?Post-op Pain Management:   ? ?Induction:  ? ?PONV Risk Score and Plan: 2 ? ?Airway Management Planned: Natural Airway ? ?Additional Equipment: None ? ?Intra-op Plan:  ? ?Post-operative Plan:  ? ?Informed Consent: I have reviewed the patients History and Physical, chart, labs and discussed the procedure including the risks, benefits and alternatives for the proposed anesthesia with the patient or authorized representative who has indicated his/her understanding and acceptance.  ? ? ? ? ? ?Plan Discussed with:  ? ?Anesthesia Plan Comments:   ? ? ? ? ? ? ?Anesthesia Quick Evaluation ? ?

## 2021-06-30 NOTE — Progress Notes (Addendum)
Patient did not received buccal cytotec yet.  ? ?Having recurrent late decelerations without any induction method on board. Resolved ultimately with fluid bolus and position changes. Not contracting frequently during this time. ? ?Will hold off on cytotec at this time and reassess in 30 mins-1 hr ? ?Renard Matter, MD, MPH ?OB Fellow, Faculty Practice ? ?

## 2021-06-30 NOTE — Lactation Note (Signed)
This note was copied from a baby's chart. ?Lactation Consultation Note ? ?Patient Name: Paige Young ?Today's Date: 06/30/2021 ?Reason for consult: Follow-up assessment;Mother's request;Primapara;1st time breastfeeding;Early term 37-38.6wks;Infant < 6lbs;Breastfeeding assistance ?Age:24 hours ? ? ?Maternal Data ?Has patient been taught Hand Expression?: Yes ?Does the patient have breastfeeding experience prior to this delivery?: No ? ?Feeding ?Mother's Current Feeding Choice: Breast Milk and Donor Milk ? ?LATCH Score ? ?Lactation Tools Discussed/Used ?Tools: Pump;Flanges ?Flange Size: 24 ?Breast pump type: Double-Electric Breast Pump (Pump set up completed and flange assessment done by Idamae Lusher) ?Pump Education: Setup, frequency, and cleaning;Milk Storage ?Reason for Pumping: increase stimulation ?Pumping frequency: every 3 hrs for 15 min ? ?Interventions ?Interventions: Breast feeding basics reviewed;Breast massage;Skin to skin;Assisted with latch;Hand express;Breast compression;Adjust position;Support pillows;Position options;Expressed milk;DEBP;Education;Pace feeding;Infant Driven Feeding Algorithm education;LPT handout/interventions;LC Services brochure ? ?Discharge ?Pump: DEBP ?WIC Program: Yes ? ?Consult Status ?Consult Status: Follow-up ?Date: 07/01/21 ?Follow-up type: In-patient ? ? ? ?Matilde Sprang Adalynd Donahoe ?06/30/2021, 3:35 PM ? ? ? ?

## 2021-06-30 NOTE — Progress Notes (Addendum)
Fetal tracing now category 1 ? ?Patient s/p cytotec x5 without cervical change. Since 1.5 cm a few hours ago.  ? ?Called to patient's bedside to further discuss plan. Patient appears tearful and frustrated. Asking if she is not making any change can she just have a c-section.  ? ?We discuss that her baby looks good now and at this time we would recommend try a FB before jumping straight to CS. This recommendation may change if fetal status changes or cervix does not change despite using FB and then pitocin. Patient is frustrated with the whole IOL process but expresses understanding of recommendation.  ? ?Offered early epidural and then once comfortable FB placement with low dose pitocin. Discussed additional benefit is if she ends up needing CS she would already have epidural. ? ?Patient would like to move forward with plan of epidural and then FB placement ? ?Warner Mccreedy, MD, MPH ?OB Fellow, Faculty Practice ? ?

## 2021-06-30 NOTE — Discharge Summary (Signed)
?  Postpartum Discharge Summary ? ? ?   ?Patient Name: Paige Young ?DOB: 11/15/1995 ?MRN: [id number] ? ?Date of admission: 06/28/2021 ?Delivery date:06/30/2021  ?Delivering provider: Verita Schneiders A  ?Date of discharge: 07/02/2021 ? ?Admitting diagnosis: Fetal growth restriction antepartum [O36.5990] ?Intrauterine pregnancy: [redacted]w[redacted]d    ?Secondary diagnosis:  Principal Problem: ?  Fetal growth restriction antepartum ?Active Problems: ?  Supervision of high-risk pregnancy ?  Cesarean delivery delivered ? ?Additional problems: None    ?Discharge diagnosis: Term Pregnancy Delivered and FGR                                               ?Post partum procedures: None ?Augmentation: Cytotec ?Complications: None ? ?Hospital course: Induction of Labor With Cesarean Section   ?26y.o. yo G1P1001 at 37w3das admitted to the hospital 06/28/2021 for induction of labor. Patient had a labor course significant for patient presented for IOL for FGR and received cytotec x7. She adamantly did not want a foley balloon placed. Her cervix remained at 1.5 cm throughout course of IOL. At that time upon multiple discussions and shared decision making patient opted for early epidural and attempt at foley balloon. However prior to foley balloon being able to be placed fetus with recurrent late decelerations and two 6 minute prolonged decelerations (without any induction method in board). Patient was given terbutaline and cesarean delivery was recommended. Patient initially hesitant and ultimately agreeable to CS for nrFHT and remote from delivery. Delivery details are as follows: ?Membrane Rupture Time/Date: 6:25 AM ,06/30/2021   ?Delivery Method:C-Section, Low Transverse  ?Details of operation can be found in separate operative Note.  Patient had an uncomplicated postpartum course. She is ambulating, tolerating a regular diet, passing flatus, and urinating well.  Patient with bilateral pitting edema as well as one BP in 130s/70s. Others  mostly in 110s/70s. Had two others that were 124/85 on POD#0 and early POD#1. Since then BP in 110S-120s and no symptoms. Given edema and some borderline BP on day 0 started  5 day course of Lasix. Patient is discharged home in stable condition on 07/02/21.     ? ?Newborn Data: ?Birth date:06/30/2021  ?Birth time:6:25 AM  ?Gender:Female  ?Living status:Living  ?Apgars:8 ,9  ?Weight:2450 g                               ? ?Magnesium Sulfate received: No ?BMZ received: No ?Rhophylac:N/A ?MMR:N/A ?T-DaP: No ?Flu: No ?Transfusion:No ? ?Physical exam  ?Vitals:  ? 07/01/21 0657 07/01/21 1428 07/01/21 2200 07/02/21 0543  ?BP: 113/66 125/78 121/78 (!) 98/59  ?Pulse: (!) 55 60 60 (!) 59  ?Resp: '18 16 17 16  ' ?Temp: 99.1 ?F (37.3 ?C) 98 ?F (36.7 ?C) 98 ?F (36.7 ?C) 98.5 ?F (36.9 ?C)  ?TempSrc: Oral Oral Oral Oral  ?SpO2: 100% 100% 100% 100%  ?Weight:      ?Height:      ? ?General: alert ?Lochia: appropriate ?Uterine Fundus: firm ?Incision: Honeycomb dressing is clean, dry, and intact ?DVT Evaluation: No evidence of DVT seen on physical exam. ? ?Lower extremity: Still with edema but no pitting, somewhat improved from yesterday ?Labs: ?Lab Results  ?Component Value Date  ? WBC 10.0 07/01/2021  ? HGB 9.3 (L) 07/01/2021  ? HCT 26.5 (L) 07/01/2021  ? MCV  91.4 07/01/2021  ? PLT 181 07/01/2021  ? ? ?  Latest Ref Rng & Units 05/30/2013  ? 10:27 PM  ?CMP  ?Glucose 70 - 99 mg/dL 90    ?BUN 6 - 23 mg/dL 8    ?Creatinine 0.47 - 1.00 mg/dL 0.55    ?Sodium 137 - 147 mEq/L 139    ?Potassium 3.7 - 5.3 mEq/L 3.7    ?Chloride 96 - 112 mEq/L 100    ?CO2 19 - 32 mEq/L 26    ?Calcium 8.4 - 10.5 mg/dL 10.0    ? ?Edinburgh Score: ? ?  06/30/2021  ? 11:35 AM  ?Flavia Shipper Postnatal Depression Scale Screening Tool  ?I have been able to laugh and see the funny side of things. 1  ?I have looked forward with enjoyment to things. 1  ?I have blamed myself unnecessarily when things went wrong. 3  ?I have been anxious or worried for no good reason. 2  ?I have felt  scared or panicky for no good reason. 0  ?Things have been getting on top of me. 2  ?I have been so unhappy that I have had difficulty sleeping. 0  ?I have felt sad or miserable. 1  ?I have been so unhappy that I have been crying. 1  ?The thought of harming myself has occurred to me. 1  ?Edinburgh Postnatal Depression Scale Total 12  ? ? ? ?After visit meds:  ?Allergies as of 07/02/2021   ? ?   Reactions  ? Shellfish Allergy Other (See Comments)  ? ?  ? ?  ?Medication List  ?  ? ?TAKE these medications   ? ?acetaminophen 500 MG tablet ?Commonly known as: TYLENOL ?Take 2 tablets (1,000 mg total) by mouth every 6 (six) hours. ?  ?albuterol 108 (90 Base) MCG/ACT inhaler ?Commonly known as: VENTOLIN HFA ?Inhale 2 puffs into the lungs every 6 (six) hours as needed for wheezing or shortness of breath. ?  ?Blood Pressure Kit Devi ?1 kit by Does not apply route once a week. Check Blood Pressure regularly and record readings into the Babyscripts App.  Large Cuff.  DX O90.0 ?  ?ferrous sulfate 325 (65 FE) MG tablet ?Take 1 tablet (325 mg total) by mouth every other day. ?Start taking on: July 03, 2021 ?  ?furosemide 20 MG tablet ?Commonly known as: LASIX ?Take 1 tablet (20 mg total) by mouth daily for 4 days. ?  ?ibuprofen 600 MG tablet ?Commonly known as: ADVIL ?Take 1 tablet (600 mg total) by mouth every 6 (six) hours. ?  ?oxyCODONE 5 MG immediate release tablet ?Commonly known as: Oxy IR/ROXICODONE ?Take 1 tablet (5 mg total) by mouth every 6 (six) hours as needed for up to 10 days for severe pain. ?  ?polyethylene glycol 17 g packet ?Commonly known as: MIRALAX / GLYCOLAX ?Take 17 g by mouth daily. ?  ?Vitafol-Nano 18-0.6-0.4 MG Tabs ?Take 1 tablet by mouth daily. ?  ? ?  ? ? ? ?Discharge home in stable condition ?Infant Feeding: Breast ?Infant Disposition:home with mother ?Discharge instruction: per After Visit Summary and Postpartum booklet. ?Activity: Advance as tolerated. Pelvic rest for 6 weeks.  ?Diet: routine  diet ?Future Appointments: ?Future Appointments  ?Date Time Provider [facility]  ?07/07/2021  1:20 PM [facility] NURSE [facility] None  ?08/07/2021 10:15 AM Griffin Basil, MD [facility] None  ? ?Follow up Visit: ?Message sent to [facility] by Dr. Cy Blamer on 4/24 ? ?Please schedule this patient for a In person postpartum visit in  4 weeks with the following provider: Any provider. ?Additional Postpartum F/U: None   ?High risk pregnancy complicated by:  FGR ?Delivery mode:  C-Section, Low Transverse  ?Anticipated Birth Control:  Unsure ? ?Renard Matter, MD, MPH ?OB Fellow, Faculty Practice ? ? ? ?

## 2021-06-30 NOTE — Progress Notes (Signed)
Patient is now agreeable to go for cesarean section as recommended, consent signed. ?FHR tracing still with baseline of 140s, moderate variability, no acceleration, no decelerations. Irregular contractions noted. ?Anesthesiology and OR teams aware.  ?Preoperative prophylactic antibiotics and SCDs ordered on call to the OR.   ?To OR when ready. ? ? ? ?Jaynie Collins, MD, FACOG ?Obstetrician Heritage manager, Faculty Practice ?Center for [employer], [facility] ?

## 2021-06-30 NOTE — Op Note (Signed)
Paige Young ?PROCEDURE DATE: 06/30/2021 ? ?PREOPERATIVE DIAGNOSES: Intrauterine pregnancy at [redacted]w[redacted]d weeks gestation;  severe intrauterine growth restriction; fetal intolerance of labor; non-reassuring fetal heart rate status ? ?POSTOPERATIVE DIAGNOSES: The same ? ?PROCEDURE: Low Transverse Cesarean Section ? ?SURGEON:  Dr. Jaynie Collins ? ?ASSISTANT:  Dr. Warner Mccreedy ? ?ANESTHESIOLOGY TEAM: Anesthesiologist: Morton Peters, MD ?CRNA: Trellis Paganini, CRNA; Rhymer, Doree Fudge, CRNA ? ?INDICATIONS: Paige Young is a 26 y.o. G1P1001 at [redacted]w[redacted]d here for cesarean section secondary to the indications listed under preoperative diagnoses; please see preoperative note for further details.  The risks of surgery were discussed with the patient including but were not limited to: bleeding which may require transfusion or reoperation; infection which may require antibiotics; injury to bowel, bladder, ureters or other surrounding organs; injury to the fetus; need for additional procedures including hysterectomy in the event of a life-threatening hemorrhage; formation of adhesions; placental abnormalities wth subsequent pregnancies; incisional problems; thromboembolic phenomenon and other postoperative/anesthesia complications.  The patient concurred with the proposed plan, giving informed written consent for the procedure.   ? ?FINDINGS:  Viable female infant in cephalic presentation.  Apgars 8 and 9.  Clear amniotic fluid.  Intact placenta, three vessel cord.  Normal uterus, fallopian tubes and ovaries bilaterally. ? ?ANESTHESIA: Epidural  ?ESTIMATED BLOOD LOSS: 750 ml ?SPECIMENS: Placenta sent to pathology ?COMPLICATIONS: None immediate ? ?PROCEDURE IN DETAIL:  The patient preoperatively received intravenous antibiotics and had sequential compression devices applied to her lower extremities.  She was then taken to the operating room where the epidural anesthesia was dosed up to surgical level  and was found to be adequate. She was then placed in a dorsal supine position with a leftward tilt, and prepped and draped in a sterile manner.  A foley catheter was placed into her bladder and attached to constant gravity.  After an adequate timeout was performed, a Pfannenstiel skin incision was made with scalpel and carried through to the underlying layer of fascia.  The fascia was incised in the midline, and this incision was extended bilaterally in a blunt fashion.  The underlying rectus muscles were dissected off the fascia superiorly and inferiorly in a blunt fashion. The rectus muscles were separated in the midline and the peritoneum was entered bluntly. The Alexis self-retaining retractor was introduced into the abdominal cavity.  Attention was turned to the lower uterine segment where a low transverse hysterotomy was made with a scalpel and extended bilaterally bluntly.  The infant was successfully delivered, the cord was clamped and cut after one minute, and the infant was handed over to the awaiting neonatology team. Uterine massage was then administered, and the placenta delivered intact with a three-vessel cord. The uterus was then cleared of clots and debris.  The hysterotomy was closed with 0 Vicryl in a running locked fashion, and an imbricating layer was also placed with 0 Vicryl.   The pelvis was cleared of all clot and debris. Hemostasis was confirmed on all surfaces.  The retractor was removed.  The peritoneum was closed with a 0 Vicryl running stitch. The fascia was then closed using 0 Vicryl in a running fashion.  The subcutaneous layer was irrigated, reapproximated with 2-0 plain gut interrupted stitches, and the skin was closed with a 4-0 Vicryl subcuticular stitch. The patient tolerated the procedure well. Sponge, instrument and needle counts were correct x 3.  She was taken to the recovery room in stable condition.  ? ? ? ?Morris Longenecker  Macon Large, MD, FACOG ?Obstetrician Heritage manager, Faculty  Practice ?Center for [employer], [facility] ?

## 2021-06-30 NOTE — Transfer of Care (Signed)
Immediate Anesthesia Transfer of Care Note ? ?[name] ? ?Procedure(s) Performed: CESAREAN SECTION (Abdomen) ? ?Patient Location: PACU ? ?Anesthesia Type:Epidural ? ?Level of Consciousness: awake, alert  and oriented ? ?Airway & Oxygen Therapy: Patient Spontanous Breathing ? ?Post-op Assessment: Report given to RN and Post -op Vital signs reviewed and stable ? ?Post vital signs: Reviewed and stable ? ?Last Vitals:  ?Vitals Value Taken Time  ?BP 100/59 06/30/21 0711  ?Temp    ?Pulse 77 06/30/21 0714  ?Resp 13 06/30/21 0714  ?SpO2 99 % 06/30/21 0714  ?Vitals shown include unvalidated device data. ? ?Last Pain:  ?Vitals:  ? 06/30/21 0115  ?TempSrc:   ?PainSc: 1   ?   ? ?Patients Stated Pain Goal: 0 (06/30/21 0115) ? ?Complications: No notable events documented. ?

## 2021-06-30 NOTE — Lactation Note (Signed)
This note was copied from a baby's chart. ?Lactation Consultation Note ? ?Patient Name: Paige Young ?Today's Date: 06/30/2021 ?Reason for consult: Follow-up assessment;Mother's request;Primapara;1st time breastfeeding;Early term 37-38.6wks;Infant < 6lbs;Breastfeeding assistance ?Age:25 hours ?LC offered 2.5 ml of colostrum on a spoon, infant emesis.  ?Infant once clearing fluid, able to latch and LC observed 15 min feeding with signs of milk transfer. ? ?LPTI guidelines provided and pump set up by [facility], Idamae Lusher. ? ?Plan 1. To feed based on cues 8-12x 24hr period. Mom to offer breasts and look for signs of milk transfer.  ?2. Mom to supplement with EBM first followed by [facility] with pace bottle feeding and yellow slow flow nipple 5-10 ml per feeding.  ?3. DEBP q 3hrs for 15 min  ?4. I and O sheet reviewed.  ?All questions answered at the end of the visit.  ? ?Maternal Data ?Has patient been taught Hand Expression?: Yes ?Does the patient have breastfeeding experience prior to this delivery?: No ? ?Feeding ?Mother's Current Feeding Choice: Breast Milk and Donor Milk ?Nipple Type: Slow - flow ? ?LATCH Score ?Latch: Repeated attempts needed to sustain latch, nipple held in mouth throughout feeding, stimulation needed to elicit sucking reflex. ? ?Audible Swallowing: Spontaneous and intermittent ? ?Type of Nipple: Everted at rest and after stimulation ? ?Comfort (Breast/Nipple): Soft / non-tender ? ?Hold (Positioning): Assistance needed to correctly position infant at breast and maintain latch. ? ?LATCH Score: 8 ? ? ?Lactation Tools Discussed/Used ?Tools: Pump;Flanges ?Flange Size: 24 ?Breast pump type: Double-Electric Breast Pump (Pump set up completed and flange assessment done by Idamae Lusher) ?Pump Education: Setup, frequency, and cleaning;Milk Storage ?Reason for Pumping: increase stimulation ?Pumping frequency: every 3 hrs for 15 min ? ?Interventions ?Interventions: Breast feeding basics reviewed;Breast  massage;Skin to skin;Assisted with latch;Hand express;Breast compression;Adjust position;Support pillows;Position options;Expressed milk;DEBP;Education;Pace feeding;Infant Driven Feeding Algorithm education;LPT handout/interventions;LC Services brochure ? ?Discharge ?Pump: DEBP ?WIC Program: Yes ? ?Consult Status ?Consult Status: Follow-up ?Date: 07/01/21 ?Follow-up type: In-patient ? ? ? ?Paige Young ?06/30/2021, 3:23 PM ? ? ? ?

## 2021-07-01 LAB — CBC
HCT: 26.5 % — ABNORMAL LOW (ref 36.0–46.0)
Hemoglobin: 9.3 g/dL — ABNORMAL LOW (ref 12.0–15.0)
MCH: 32.1 pg (ref 26.0–34.0)
MCHC: 35.1 g/dL (ref 30.0–36.0)
MCV: 91.4 fL (ref 80.0–100.0)
Platelets: 181 10*3/uL (ref 150–400)
RBC: 2.9 MIL/uL — ABNORMAL LOW (ref 3.87–5.11)
RDW: 13.1 % (ref 11.5–15.5)
WBC: 10 10*3/uL (ref 4.0–10.5)
nRBC: 0 % (ref 0.0–0.2)

## 2021-07-01 MED ORDER — POLYETHYLENE GLYCOL 3350 17 G PO PACK
17.0000 g | PACK | Freq: Every day | ORAL | Status: DC
  Administered 2021-07-01 – 2021-07-02 (×2): 17 g via ORAL
  Filled 2021-07-01 (×2): qty 1

## 2021-07-01 MED ORDER — FUROSEMIDE 20 MG PO TABS
20.0000 mg | ORAL_TABLET | Freq: Every day | ORAL | Status: DC
  Administered 2021-07-01 – 2021-07-02 (×2): 20 mg via ORAL
  Filled 2021-07-01 (×2): qty 1

## 2021-07-01 NOTE — Lactation Note (Addendum)
This note was copied from a baby's chart. ?Lactation Consultation Note ? ?Patient Name: Paige Young ?Today's Date: 07/01/2021 ?Reason for consult: Follow-up assessment;Mother's request;Difficult latch;Exclusive pumping and bottle feeding;Early term 37-38.6wks;Nipple pain/trauma;Breastfeeding assistance;RN request  ?Age:25 hours ? ?Mom complaining of nipple pain from pumping on higher setting. LC adjusted flange size and lowered setting. Mom using 24 and 21, use coconut oil before pumping.  ?Mom declined wanting to latch at this time.  ? ?Infant fed DBM see flowsheet. LC went over feeding volumes if infant not latching at the breast to offer more.  ? ?Plan 1. To feed based on cues 8-12x 24hr period. Mom to offer breasts when she is ready to work on latching again, currently taking a breast rest.  ?2. Mom to supplement with EBM first followed by [facility] with yellow slow flow nipple. Infant feedings 30 ml or more if not latching at the breast.  ?3. DEBP q 3hrs for 15 min  ?All questions answered at the end of the visit.  ? ?Maternal Data ?Has patient been taught Hand Expression?: Yes ? ?Feeding ?Mother's Current Feeding Choice: Breast Milk and Donor Milk ?Nipple Type: Slow - flow ? ?LATCH Score ?  ? ?  ? ?  ? ?  ? ?  ? ?  ? ? ?Lactation Tools Discussed/Used ?Tools: Pump;Flanges;Coconut oil ?Flange Size: 21;24 ?Breast pump type: Double-Electric Breast Pump ?Pump Education: Setup, frequency, and cleaning;Milk Storage ?Reason for Pumping: increase stimulation ?Pumping frequency: every 3 hrs for 15 min ? ?Interventions ?Interventions: Breast feeding basics reviewed;Breast massage;Breast compression;Expressed milk;Coconut oil;DEBP;LC Services brochure;Infant Driven Feeding Algorithm education ? ?Discharge ?  ? ?Consult Status ?Consult Status: Follow-up ?Date: 07/02/21 ?Follow-up type: In-patient ? ? ? ?Samai Corea  Nicholson-Springer ?07/01/2021, 6:18 PM ? ? ? ?

## 2021-07-01 NOTE — Social Work (Signed)
CSW received consult for hx of Major Depression and Edinburgh 12. CSW met with MOB to offer support and complete assessment.   ? ?CSW met with MOB at bedside and introduced CSW role. CSW observed MOB in the bed on a face time call and FOB present holding the infant. CSW offered MOB privacy. MOB gave CSW permission to share all information in front of FOB and her mom via the face time call.  MOB identified her mom and FOB as supports. CSW inquired how MOB has felt since giving birth. MOB reported, " I am having pain but doing okay." MOB shared that she did not have a good birth experience. MOB reported that she given oral medications to induce her labor which did not work, and she had to have a caesarian section which she was not in favor of but agreeable to ensure the baby was healthy. CSW provided active listening and acknowledged MOB concerns. CSW inquired how MOB has felt since giving birth. MOB reported that she felt depressed during the pregnancy however things started getting better towards the end of pregnancy. MOB expressed having several stressors during the pregnancy. MOB reported that she lost her apartment, she lost her job and had to serve time in jail for failure to appear to court due to an incident related to her insurance. MOB reported, "I have never been in trouble in life, and everything was happening to me." MOB reported that she felt like she was losing her independence. MOB reported that she sought treatment at [facility] outpatient psychiatric program which she felt was helpful at time. MOB reported no medications given and she is not interested. MOB reported also having passive SI thoughts with no plan. MOB denied recent SI/HI thoughts. MOB reported the [place] reflects how she felt prior to the past seven days. She reported feeling much better the past seven days. MOB reported that since moving in with mom she has felt more comfortable and supported. CSW inquired about MOB coping strategies. MOB  reported ?I am taking things step by step." MOB explained that she focuses on the positives and tries to find solutions to issues, she stays focus, and writes goals for herself that now include the baby. MOB reported she has a purpose for each day. CSW acknowledged MOB efforts and provided encouragement. CSW assessed MOB safety. MOB denied thoughts of harm to self and others.  ? ?CSW provided education regarding the baby blues period vs. perinatal mood disorders, discussed treatment and gave resources for mental health follow up if concerns arise. CSW recommended MOB complete a self-evaluation during the postpartum time period using the New Mom Checklist from Postpartum Progress and encouraged MOB to contact a medical professional if symptoms are noted at any time.   ? ?CSW provided review of Sudden Infant Death Syndrome (SIDS) precautions. MOB reported understanding the SIDS precautions. MOB has chosen [facility] for the infant's follow up care. MOB reported that she receives WIC/FS and will call to update the agencies about the birth. CSW educated MOB about [employer] MOB gave CSW permission to make a referral.  ? ?CSW identifies no further need for intervention and no barriers to discharge at this time.  ? ?Kathrin Greathouse, MSW, LCSW ?[facility] [place]  ?Clinical Social Worker  ?[phone] ?07/01/2021  2:50 PM  ?

## 2021-07-01 NOTE — Progress Notes (Addendum)
POSTPARTUM PROGRESS NOTE ? ?Subjective: Paige Young is a 26 y.o. G1P1001 POD#1 s/p pLTCS at [redacted]w[redacted]d.  She reports she doing well. No acute events overnight. She denies any problems with ambulating, voiding or po intake. Denies nausea or vomiting. She has not yet passed flatus but has no bloating or gas pain. Pain is well controlled.  Lochia is scant. ? ?Objective: ?Blood pressure 113/66, pulse (!) 55, temperature 99.1 ?F (37.3 ?C), temperature source Oral, resp. rate 18, height 5\' 2"  (1.575 m), weight 64.1 kg, last menstrual period 10/05/2020, SpO2 100 %, unknown if currently breastfeeding. ? ?Physical Exam:  ?General: alert, cooperative and no distress ?Chest: no respiratory distress ?Abdomen: soft, non-tender  incision clean dry/intact, pressure dressing in place  ?Uterine Fundus: firm, appropriately tender ?Extremities: No calf swelling or tenderness  1-2+ pitting edema up 2/3 way to knee ? ?Recent Labs  ?  06/28/21 ?1210 07/01/21 ?0416  ?HGB 13.2 9.3*  ?HCT 38.1 26.5*  ? ? ?Assessment/Plan: ?Paige Young is a 26 y.o. G1P1001 POD#1 s/p pLTCS at [redacted]w[redacted]d ? ?POD#1: Doing well, pain well-controlled. ?-- Routine postpartum care ?-- Encouraged up OOB ?-- Lovenox for VTE prophylaxis ? ?#Acute blood loss anemia ?HgB 13.2>9.3. Asymptomatic. Plan for PO iron ? ?#edema ?#Mild BP elevation ?Patient with bilateral pitting edema as well as one BP in 130s/70s. Others mostly in 110s/70s. Had two others that were 124/85. No symptoms. Given edema discussed Lasix and patient amenable to trying. ?- lasix 20mg  Daily ?- watch BPs ? ?Routine Postpartum Care ?-- Contraception: plans outpatient Nexplanon ?-- Feeding: breast ? ?Dispo: Plan for discharge POD#2 if medically appropriate. Patient interested in POD#2 DC if appropriate. ? ?Renard Matter, MD, MPH ?OB Fellow, Faculty Practice ?Center for [place]  ? ? ?

## 2021-07-02 ENCOUNTER — Ambulatory Visit: Payer: Medicaid Other

## 2021-07-02 LAB — SURGICAL PATHOLOGY

## 2021-07-02 MED ORDER — FERROUS SULFATE 325 (65 FE) MG PO TABS
325.0000 mg | ORAL_TABLET | ORAL | 0 refills | Status: AC
Start: 2021-07-03 — End: 2021-09-01

## 2021-07-02 MED ORDER — IBUPROFEN 600 MG PO TABS: 600.0000 mg | ORAL_TABLET | Freq: Four times a day (QID) | ORAL | 0 refills | Status: DC

## 2021-07-02 MED ORDER — OXYCODONE HCL 5 MG PO TABS: 5.0000 mg | ORAL_TABLET | Freq: Four times a day (QID) | ORAL | 0 refills | Status: AC | PRN

## 2021-07-02 MED ORDER — POLYETHYLENE GLYCOL 3350 17 G PO PACK: 17.0000 g | PACK | Freq: Every day | ORAL | 0 refills | Status: AC

## 2021-07-02 MED ORDER — FUROSEMIDE 20 MG PO TABS
20.0000 mg | ORAL_TABLET | Freq: Every day | ORAL | 0 refills | Status: DC
Start: 2021-07-02 — End: 2023-07-08

## 2021-07-02 MED ORDER — ACETAMINOPHEN 500 MG PO TABS: 1000.0000 mg | ORAL_TABLET | Freq: Four times a day (QID) | ORAL | 0 refills | Status: AC

## 2021-07-02 NOTE — Progress Notes (Incomplete)
Patient ID: Paige Young, female   DOB: Jul 25, 1995, 26 y.o.   MRN: [id number] ?POSTPARTUM PROGRESS NOTE ? ?Post Partum Day 2 ? ?Subjective: ? ?Paige Young is a 26 y.o. G1P1001 s/p c-section at [redacted]w[redacted]d.  No acute events overnight.  Pt reports problems with ambulating, states her legs feel very heavy, difficult to walk. Pt denies problems with voiding or po intake.  She denies nausea or vomiting.  Pain is well controlled.  She has had flatus. She has not had bowel movement.  Lochia Minimal.  ? ?Objective: ?Blood pressure (!) 98/59, pulse (!) 59, temperature 98.5 ?F (36.9 ?C), temperature source Oral, resp. rate 16, height 5\' 2"  (1.575 m), weight 64.1 kg, last menstrual period 10/05/2020, SpO2 100 %, unknown if currently breastfeeding. ? ?Physical Exam:  ?General: alert, cooperative and no distress ?Chest: no respiratory distress ?Heart:regular rate, distal pulses intact ?Abdomen: soft, nontender,  ?Uterine Fundus: firm, appropriately tender ?DVT Evaluation: No calf swelling or tenderness ?Extremities: 2+ edema ?Skin: warm, dry; incision clean/dry/intact. ? ?Recent Labs  ?  07/01/21 ?0416  ?HGB 9.3*  ?HCT 26.5*  ? ? ? ?Assessment/Plan: ?Paige Young is a 26 y.o. G1P1001 s/p c-section at [redacted]w[redacted]d  ? ?PPD#2 - Doing well ?Contraception: Nexplanon at postpartum visit ?Feeding: Breast/bottle ?Dispo: Plan for discharge in progress. ? ? LOS: 4 days  ? ?[redacted]w[redacted]d, Student-PA ?07/02/2021, 7:48 AM   ?

## 2021-07-07 ENCOUNTER — Ambulatory Visit: Payer: Medicaid Other

## 2021-07-08 ENCOUNTER — Telehealth (HOSPITAL_COMMUNITY): Payer: Self-pay | Admitting: *Deleted

## 2021-07-08 NOTE — Telephone Encounter (Signed)
Attempted Hospital Discharge Follow-Up Call.  Left voice mail requesting that patient return RN's phone call.  

## 2021-07-10 ENCOUNTER — Ambulatory Visit (INDEPENDENT_AMBULATORY_CARE_PROVIDER_SITE_OTHER): Payer: Medicaid Other | Admitting: Emergency Medicine

## 2021-07-10 VITALS — BP 115/74 | HR 88 | Wt 131.1 lb

## 2021-07-10 DIAGNOSIS — M7989 Other specified soft tissue disorders: Secondary | ICD-10-CM

## 2021-07-10 MED ORDER — FUROSEMIDE 20 MG PO TABS: 20.0000 mg | ORAL_TABLET | Freq: Two times a day (BID) | ORAL | 0 refills | Status: DC

## 2021-07-10 NOTE — Progress Notes (Signed)
Pt presents for BP & incision check 10 days post c-section on 4/24. Denies headache, vision changes.Reports bilateral lower extremity swelling. BP-115/74  P 88 ?Swelling assessed by Elgie Congo, MD- +2 in bilateral extremities. Verbal order for Lasix 20mg  BID for 4 days. ? ?Wound clean, dry and intact. Tegaderm dressing removed in office. Pt instructed to shower as normal, without scrubbing to the site. Assess and report any drainage, odor, increased pain. Pt verbalized understanding. ? ?[place] -107 - Has [place] appointment scheduled for 5/10.  ?

## 2021-07-10 NOTE — Progress Notes (Signed)
Patient was assessed and managed by nursing staff during this encounter. I have reviewed the chart and agree with the documentation and plan. I have also made any necessary editorial changes. ? ?Griffin Basil, MD ?07/10/2021 3:18 PM   ?

## 2021-07-16 ENCOUNTER — Telehealth: Payer: Self-pay | Admitting: Licensed Clinical Social Worker

## 2021-07-16 ENCOUNTER — Encounter: Payer: Medicaid Other | Admitting: Licensed Clinical Social Worker

## 2021-07-16 NOTE — Telephone Encounter (Signed)
Called pt regarding scheduled visit. Left message for callback  

## 2021-08-07 ENCOUNTER — Ambulatory Visit (INDEPENDENT_AMBULATORY_CARE_PROVIDER_SITE_OTHER): Payer: Medicaid Other | Admitting: Obstetrics and Gynecology

## 2021-08-07 ENCOUNTER — Encounter: Payer: Self-pay | Admitting: Obstetrics and Gynecology

## 2021-08-07 NOTE — Progress Notes (Signed)
Post Partum Visit Note  Paige Young is a 26 y.o. G74P1001 female who presents for a postpartum visit. She is 5 weeks postpartum following a primary cesarean section.  I have fully reviewed the prenatal and intrapartum course. The delivery was at 38 gestational weeks.  Anesthesia: spinal. Postpartum course has been ok. Baby is doing well. Baby is feeding by bottle - Similac with Iron. Bleeding no bleeding. Bowel function is normal. Bladder function is normal. Patient is sexually active. Contraception method is Nexplanon. Postpartum depression screening: negative.   Upstream - 08/07/21 1093       Pregnancy Intention Screening   Does the patient want to become pregnant in the next year? No    Does the patient's partner want to become pregnant in the next year? No    Would the patient like to discuss contraceptive options today? Yes            The pregnancy intention screening data noted above was reviewed. Potential methods of contraception were discussed. The patient elected to proceed with No data recorded.   Edinburgh Postnatal Depression Scale - 08/07/21 0950       Edinburgh Postnatal Depression Scale:  In the Past 7 Days   I have been able to laugh and see the funny side of things. 0    I have looked forward with enjoyment to things. 1    I have blamed myself unnecessarily when things went wrong. 0    I have been anxious or worried for no good reason. 1    I have felt scared or panicky for no good reason. 0    Things have been getting on top of me. 0    I have been so unhappy that I have had difficulty sleeping. 0    I have felt sad or miserable. 2    I have been so unhappy that I have been crying. 1    The thought of harming myself has occurred to me. 0    Edinburgh Postnatal Depression Scale Total 5             Health Maintenance Due  Topic Date Due   COVID-19 Vaccine (1) Never done   HPV VACCINES (1 - 2-dose series) Never done   PAP-Cervical Cytology  Screening  Never done   PAP SMEAR-Modifier  Never done    The following portions of the patient's history were reviewed and updated as appropriate: allergies, current medications, past family history, past medical history, past social history, past surgical history, and problem list.  Review of Systems Pertinent items are noted in HPI.  Objective:  BP 114/77   Pulse 65   Ht 5\' 2"  (1.575 m)   Wt 121 lb 4.8 oz (55 kg)   BMI 22.19 kg/m    General:  alert, cooperative, and no distress   Breasts:  not indicated  Lungs: clear to auscultation bilaterally  Heart:  regular rate and rhythm  Abdomen: soft, non-tender; bowel sounds normal; no masses,  no organomegaly   Wound well approximated incision  GU exam:  not indicated       Assessment:    Encounter postpartum  Normal postpartum exam.   Plan:   Essential components of care per ACOG recommendations:  1.  Mood and well being: Patient with negative depression screening today. Reviewed local resources for support.  - Patient tobacco use? Yes. Patient desires to quit? No.   - hx of drug use? No.  2. Infant care and feeding:  -Patient currently breastmilk feeding? No.  -Social determinants of health (SDOH) reviewed in EPIC. No concerns  3. Sexuality, contraception and birth spacing - Patient does not want a pregnancy in the next year.  Desired family size is unknown .  - Reviewed reproductive life planning. Reviewed contraceptive methods based on pt preferences and effectiveness.  Patient desired Hormonal Implant today.   - Discussed birth spacing of 18 months  4. Sleep and fatigue -Encouraged family/partner/community support of 4 hrs of uninterrupted sleep to help with mood and fatigue  5. Physical Recovery  - Discussed patients delivery and complications. She describes her labor as mixed. - Patient had a C-section emergent. Patient had no laceration. Perineal healing reviewed. Patient expressed understanding - Patient  has urinary incontinence? No. - Patient is safe to resume physical and sexual activity  6.  Health Maintenance - HM due items addressed Yes - Last pap smear No results found for: DIAGPAP Pap smear not done at today's visit.  -Breast Cancer screening indicated? No.   7. Chronic Disease/Pregnancy Condition follow up: Hypertension BP normal today, pt is not on meds, she did take lasix after her discharge briefly  - PCP follow up  Pt had unprotected c intercourse over the weekend, pt will have nexplanon placement in 7-10 days  Warden Fillers, MD Center for [employer], [facility]

## 2021-09-08 ENCOUNTER — Ambulatory Visit: Payer: Medicaid Other | Admitting: Obstetrics and Gynecology

## 2022-12-16 IMAGING — US US MFM OB FOLLOW-UP
1 series · 13 of 28 positions shown · non-contrast
Comparison: none

[Series 1: us mfm ob follow-up · 44 acquisitions, 13 frames shown]
[im 2/44]
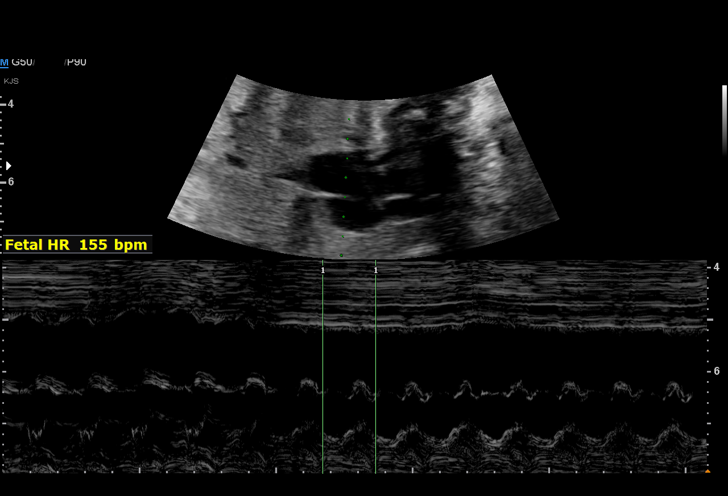
[im 5/44]
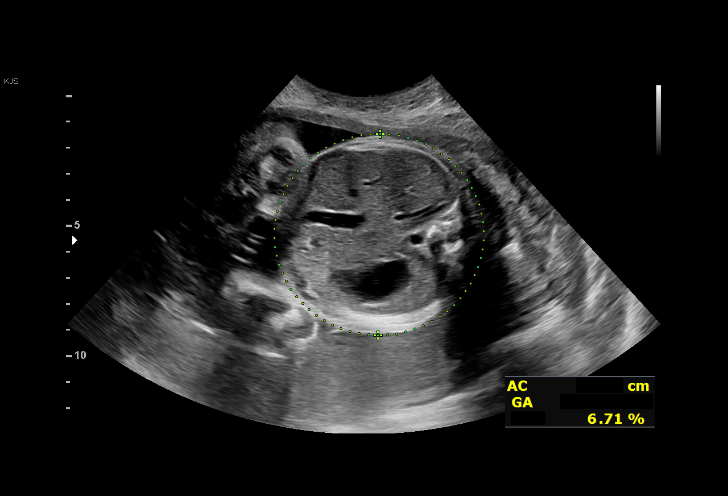
[im 8/44]
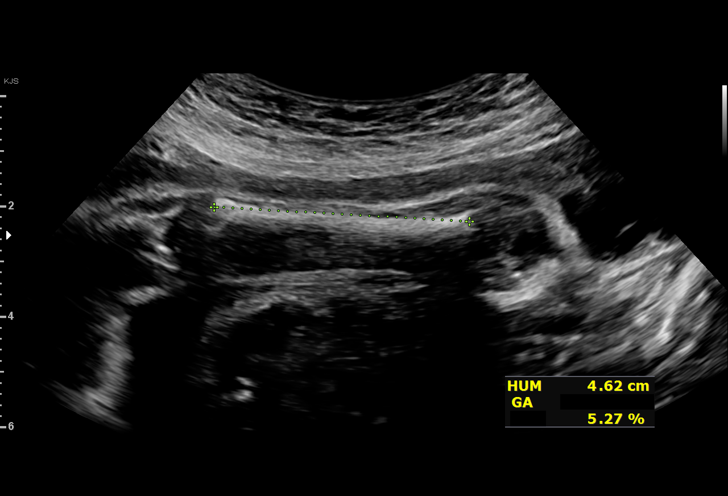
[im 12/44]
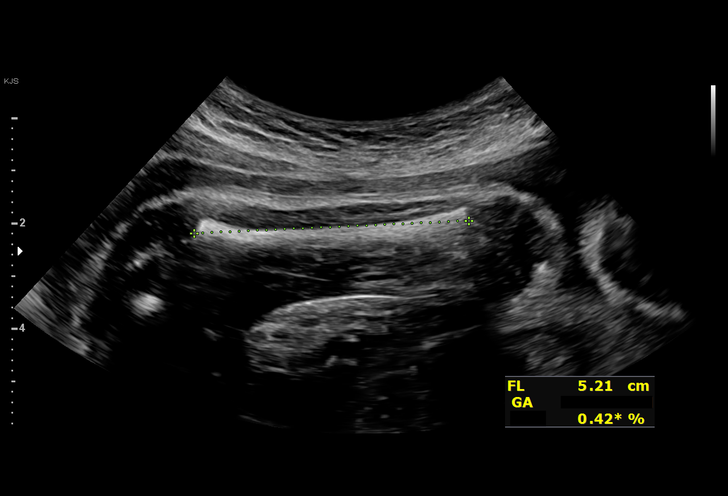
[im 15/44]
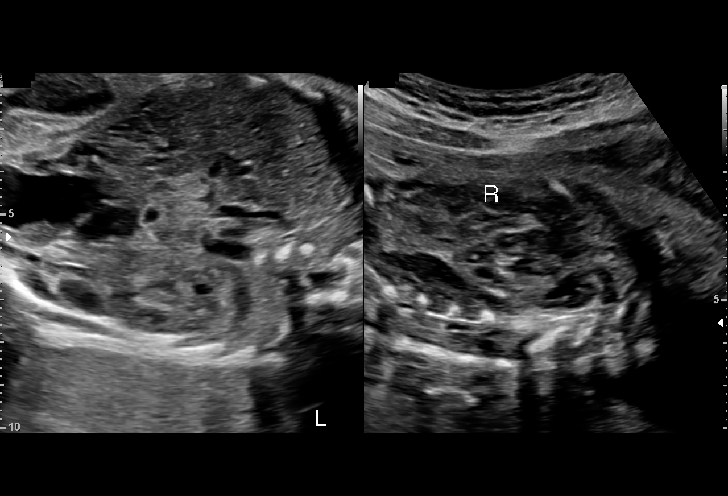
[im 18/44]
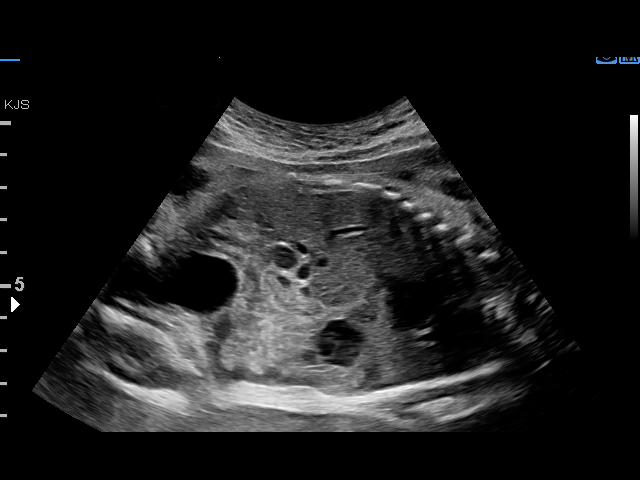
[im 23/44]
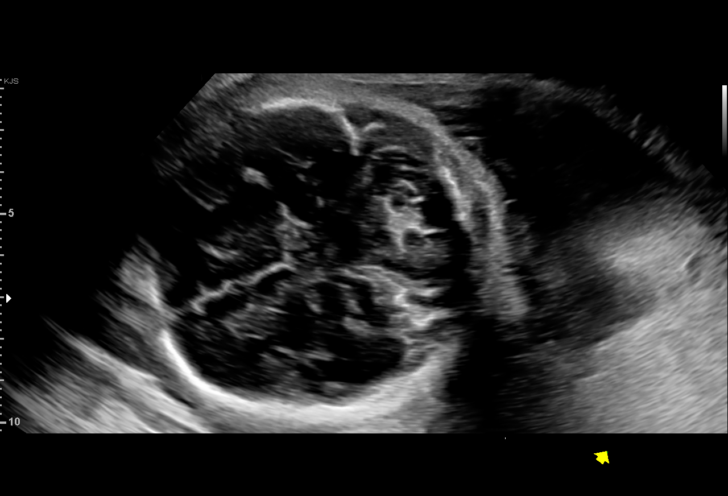
[im 26/44]
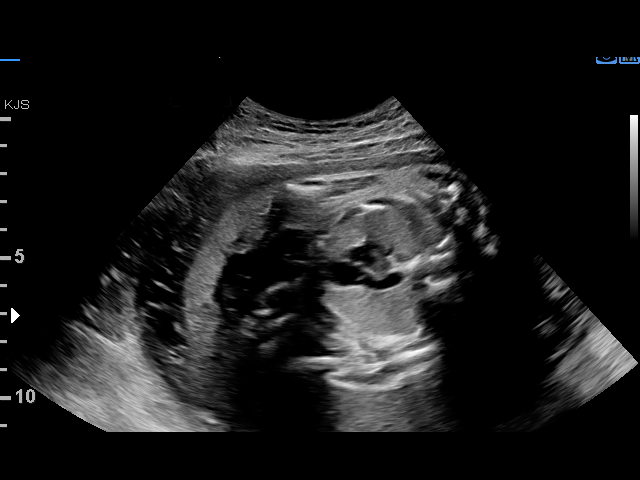
[im 29/44]
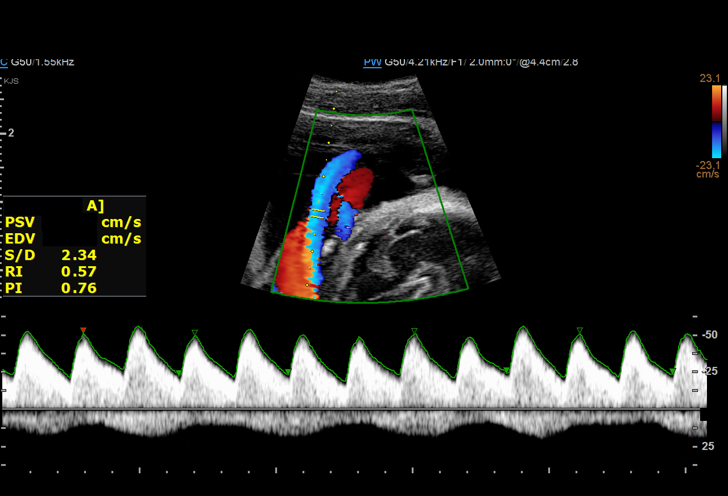
[im 32/44]
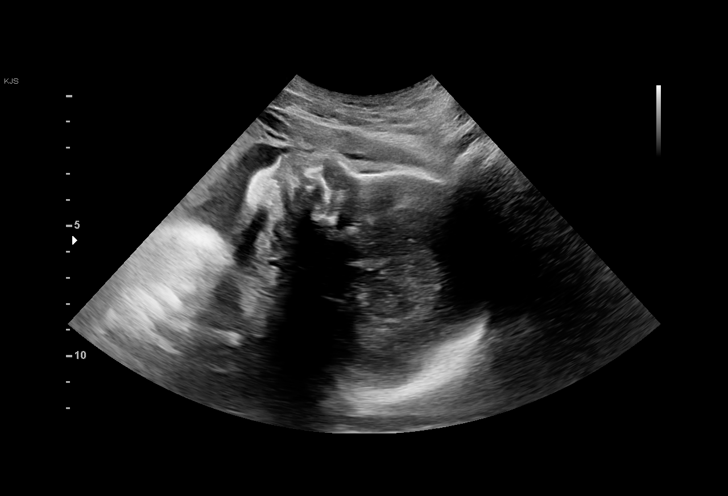
[im 36/44]
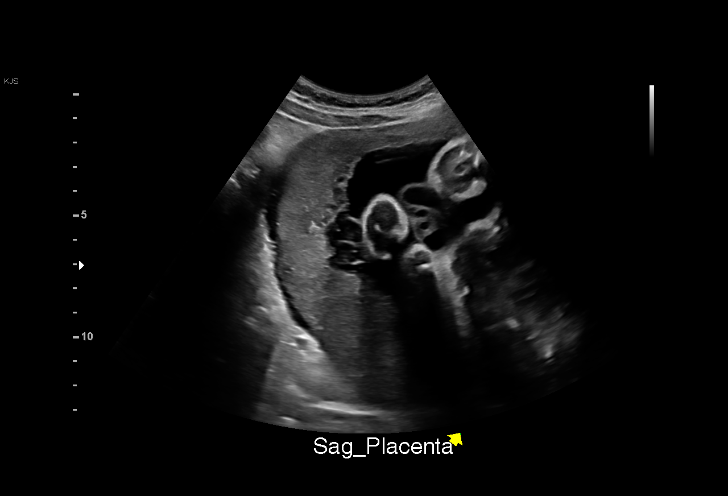
[im 39/44]
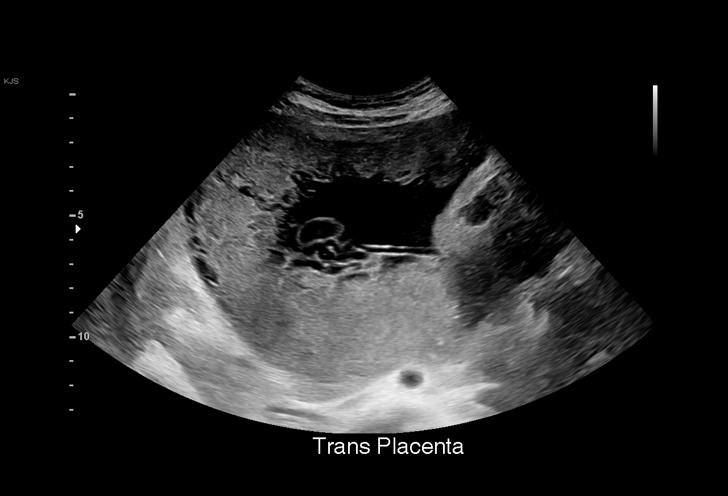
[im 42/44]
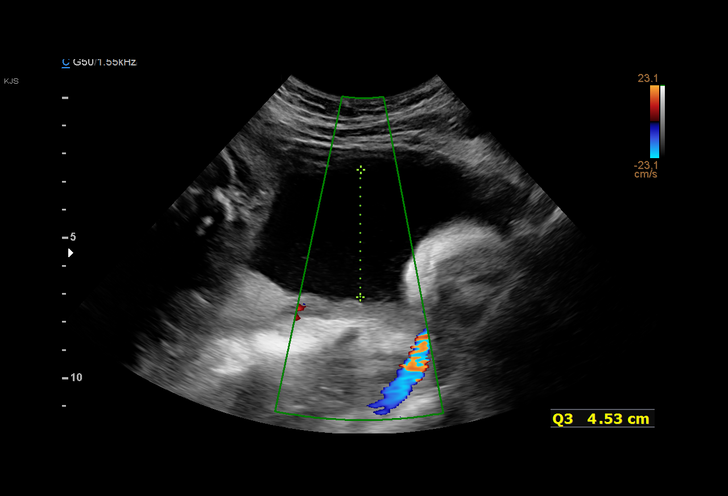

[13 of 28 positions shown; findings below may reference images not displayed]

Indications

 Maternal care for known or suspected poor
 fetal growth, second trimester, not applicable
 or unspecified IUGR
 30 weeks gestation of pregnancy
 Asthma                                         VLL.FL j11.272
 LR NIPS - Male, Neg Horizon, Neg AFP
Fetal Evaluation

 Num Of Fetuses:         1
 Fetal Heart Rate(bpm):  155
 Cardiac Activity:       Observed
 Presentation:           Cephalic
 Placenta:               Posterior Fundal
 P. Cord Insertion:      Previously Visualized

 Amniotic Fluid
 AFI FV:      Within normal limits

 AFI Sum(cm)     %Tile       Largest Pocket(cm)
 16.67           61          5

 RUQ(cm)       RLQ(cm)       LUQ(cm)        LLQ(cm)
 3.46          5
Biometry

 BPD:        80  mm     G. Age:  32w 1d         80  %    CI:        80.77   %    70 - 86
                                                         FL/HC:      18.5   %    19.3 -
 HC:      281.1  mm     G. Age:  30w 6d         17  %    HC/AC:      1.14        0.96 -
 AC:      247.4  mm     G. Age:  29w 0d          7  %    FL/BPD:     65.1   %    71 - 87
 FL:       52.1  mm     G. Age:  27w 5d        < 1  %    FL/AC:      21.1   %    20 - 24
 HUM:      46.2  mm     G. Age:  27w 2d        < 5  %
 CER:      38.6  mm     G. Age:  31w 4d         53  %
 LV:          3  mm

 Est. FW:    1818  gm    2 lb 14 oz     3.8  %
OB History

 Gravidity:    1
Gestational Age

 LMP:           30w 5d        Date:  10/05/20                 EDD:   07/12/21
 U/S Today:     30w 0d                                        EDD:   07/17/21
 Best:          30w 5d     Det. By:  LMP  (10/05/20)          EDD:   07/12/21
Anatomy

 Cranium:               Appears normal         LVOT:                   Previously seen
 Cavum:                 Appears normal         Aortic Arch:            Previously seen
 Ventricles:            Appears normal         Ductal Arch:            Previously seen
 Choroid Plexus:        Previously seen        Diaphragm:              Appears normal
 Cerebellum:            Previously seen        Stomach:                Appears normal, left
                                                                       sided
 Posterior Fossa:       Previously seen        Abdomen:                Previously seen
 Nuchal Fold:           Previously seen        Abdominal Wall:         Previously seen
 Face:                  Orbits and profile     Cord Vessels:           Previously seen
                        previously seen
 Lips:                  Previously seen        Kidneys:                Appear normal
 Palate:                Previously seen        Bladder:                Appears normal
 Thoracic:              Previously seen        Spine:                  Previously seen
 Heart:                 Appears normal         Upper Extremities:      Previously seen
                        (4CH, axis, and
                        situs)
 RVOT:                  Previously seen        Lower Extremities:      Previously seen

 Other:  Male gender previously seen. Heels/feet and open hands/5th digits,
         nasal bone, lenses visualized previously.
Doppler - Fetal Vessels

 Umbilical Artery
  S/D     %tile      RI    %tile      PI    %tile            ADFV    RDFV
  2.39       25    0.58       24    0.82       24               No      No

Cervix Uterus Adnexa
 Cervix
 Not visualized (advanced GA >32wks)
Impression

 Follow up growth due to prior FGR of 7%
 Positive interval growth with measurements consistent with
 severe FGR of 3.8%.
 Good fetal movement and amniotic fluid volume
 NST reactive today

 UA Dopplers are normal without evidence of AEDF or REDF.

 I discussed today's visit with Ms. Bambucafe and explained the
 worsening FGR however, fetal testing is good and she
 experiences great fetal movement. I explained that if this
 growth percentage persist we recommend delivery at 37
 weeks.
Recommendations

 Continue weekly testing with NST until 32 weeks
 At the 32 weeks initiate weekly BPP.
 Repeat growth scheduled in 3 weeks.

## 2023-01-16 ENCOUNTER — Ambulatory Visit (HOSPITAL_COMMUNITY): Payer: Medicaid Other

## 2023-01-17 IMAGING — US US MFM FETAL BPP W/ NON-STRESS
1 series · 14 of 28 positions shown · non-contrast
Comparison: none

[Series 1: us mfm fetal bpp w/ non-stress · 42 acquisitions, 14 frames shown]
[im 2/42]
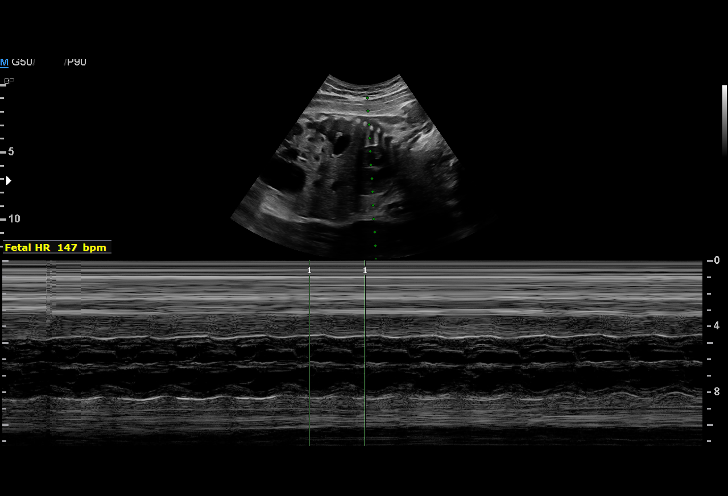
[im 5/42]
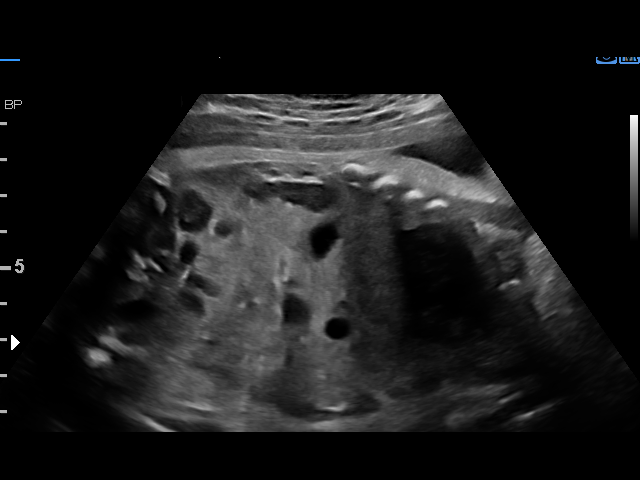
[im 8/42]
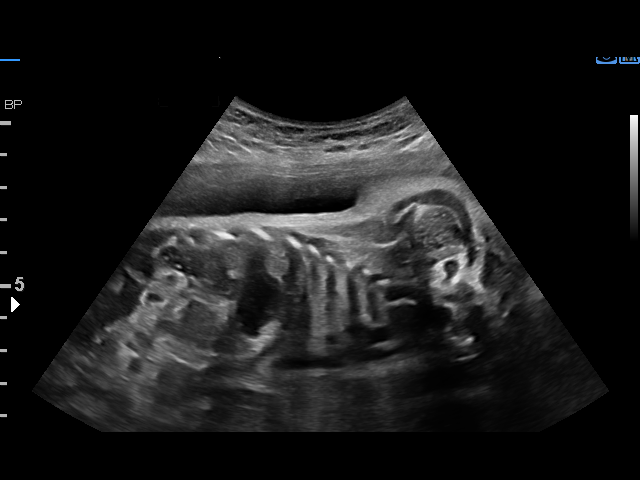
[im 11/42]
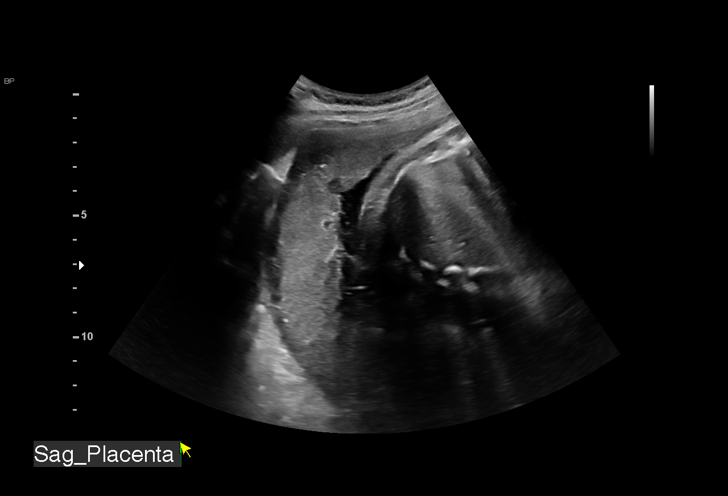
[im 14/42]
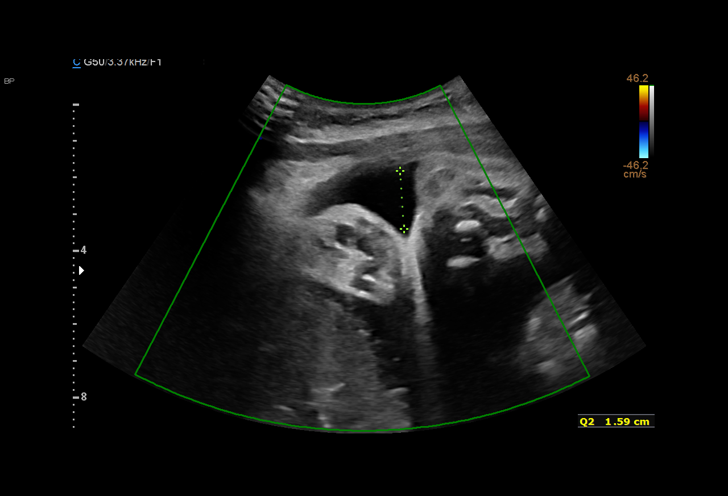
[im 17/42]
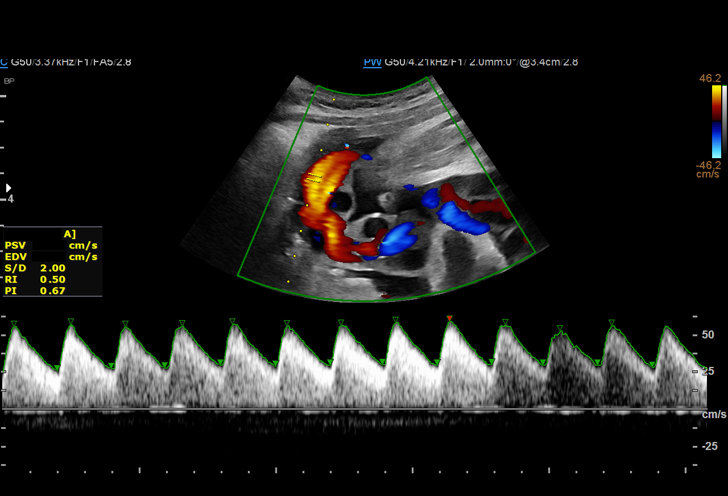
[im 20/42]
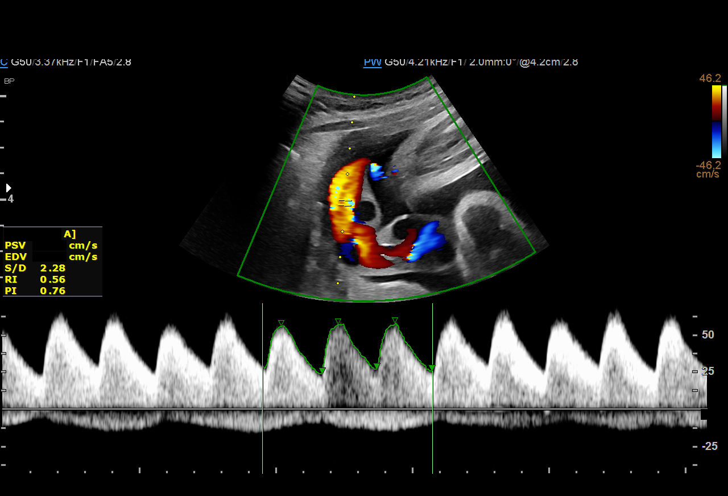
[im 23/42]
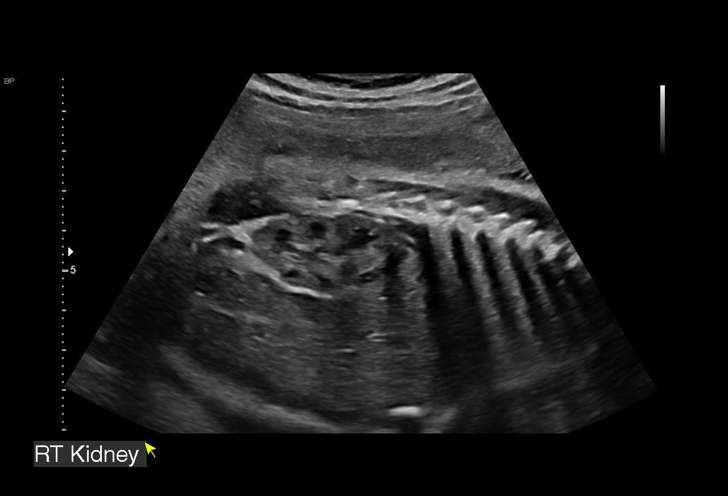
[im 26/42]
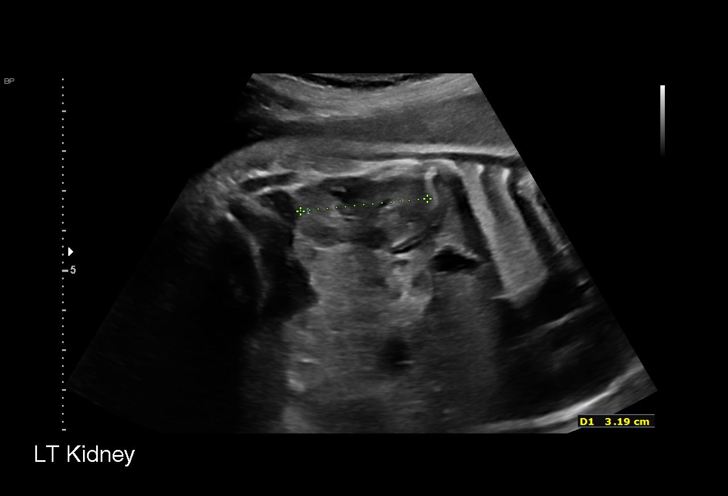
[im 29/42]
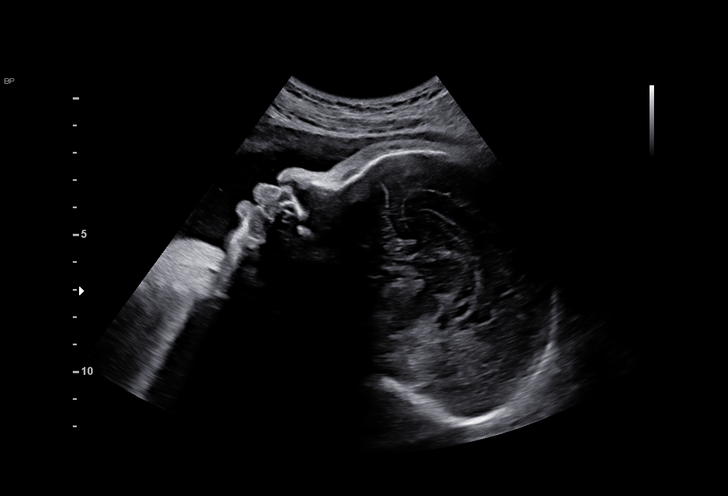
[im 32/42]
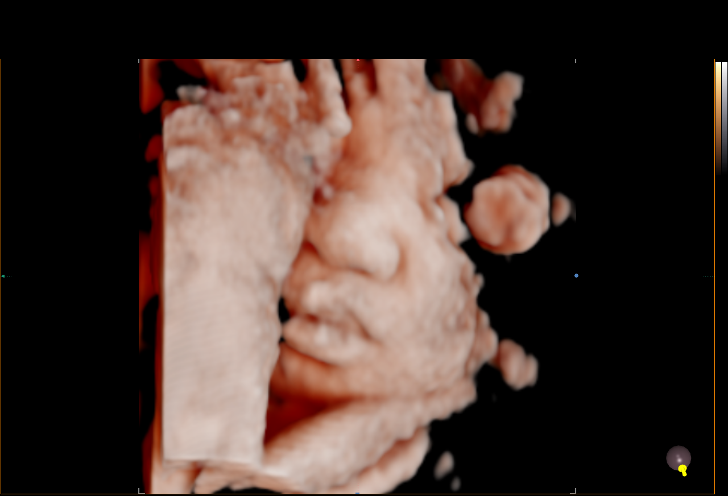
[im 35/42]
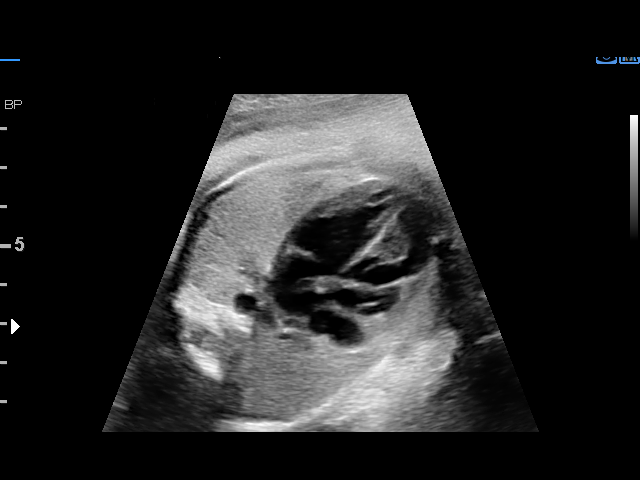
[im 38/42]
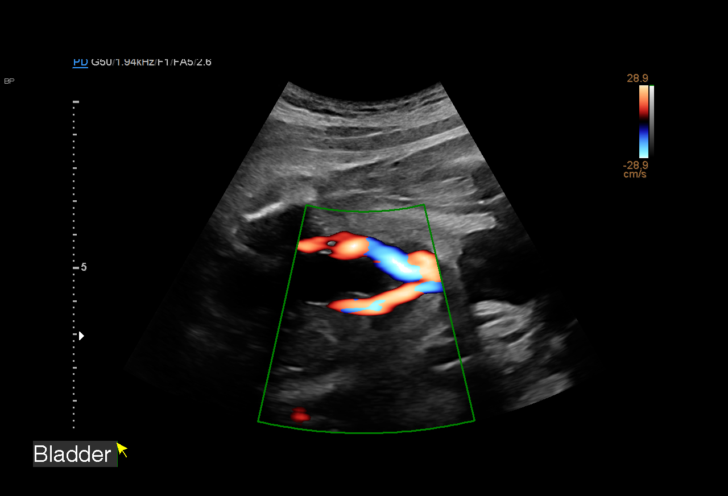
[im 42/42]
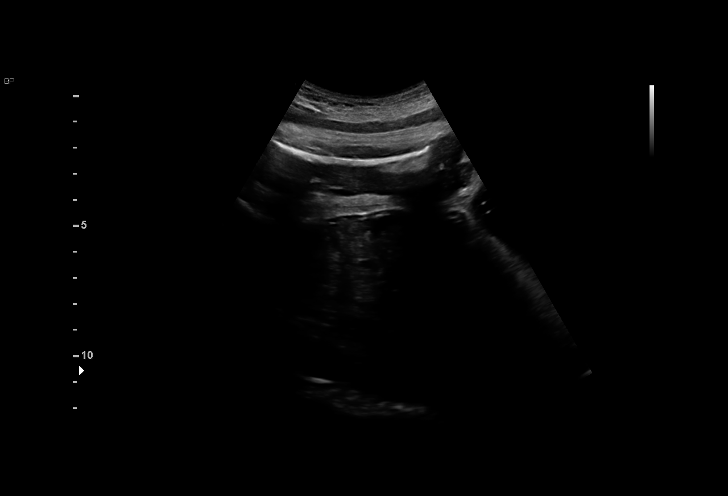

[14 of 28 positions shown; findings below may reference images not displayed]

1  US MFM UA CORD DOPPLER                76820.02    NOK
                                                      SORIN
    HAGOSA/NONSTRESS                                       SORIN

Indications

 Maternal care for known or suspected poor
 fetal growth, third trimester, not applicable or
 unspecified IUGR
 Asthma                                         2NN.5N j64.070
 LR NIPS - Male, Neg Horizon, Neg AFP
 35 weeks gestation of pregnancy
Fetal Evaluation

 Num Of Fetuses:         1
 Fetal Heart Rate(bpm):  147
 Cardiac Activity:       Observed
 Presentation:           Cephalic
 Placenta:               Posterior
 P. Cord Insertion:      Previously Visualized

 Amniotic Fluid
 AFI FV:      Within normal limits

 AFI Sum(cm)     %Tile       Largest Pocket(cm)
 9.5             17

 RUQ(cm)       RLQ(cm)       LUQ(cm)        LLQ(cm)

Biophysical Evaluation

 Amniotic F.V:   Pocket => 2 cm             F. Tone:        Observed
 F. Movement:    Observed                   N.S.T:          Reactive
 F. Breathing:   Observed                   Score:          [DATE]
OB History

 Gravidity:    1
Gestational Age

 LMP:           35w 2d        Date:  10/05/20                  EDD:   07/12/21
 Best:          35w 2d     Det. By:  LMP  (10/05/20)          EDD:   07/12/21
Anatomy

 Cranium:               Appears normal         LVOT:                   Previously seen
 Cavum:                 Previously seen        Aortic Arch:            Previously seen
 Ventricles:            Previously seen        Ductal Arch:            Previously seen
 Choroid Plexus:        Previously seen        Diaphragm:              Appears normal
 Cerebellum:            Previously seen        Stomach:                Appears normal, left
                                                                       sided
 Posterior Fossa:       Previously seen        Abdomen:                Appears normal
 Nuchal Fold:           Previously seen        Abdominal Wall:         Previously seen
 Face:                  Profile nl; orbits     Cord Vessels:           Previously seen
                        previously visualized
 Lips:                  Previously seen        Kidneys:                Appear normal
 Palate:                Previously seen        Bladder:                Appears normal
 Thoracic:              Appears normal         Spine:                  Previously seen
 Heart:                 Appears normal         Upper Extremities:      Previously seen
                        (4CH, axis, and
                        situs)
 RVOT:                  Previously seen        Lower Extremities:      Previously seen

 Other:  Male gender previously seen. Heels/feet and open hands/5th digits,
         nasal bone, lenses visualized previously.
Doppler - Fetal Vessels

 Umbilical Artery
  S/D     %tile      RI    %tile      PI    %tile     PSV    ADFV    RDFV
                                                    (cm/s)
  2.15       29    0.54       31    0.6[REDACTED]      No      No

Cervix Uterus Adnexa

 Cervix
 Not visualized (advanced GA >48wks)

 Uterus
 Normal shape and size.

 Right Ovary
 Within normal limits.
 Left Ovary
 Within normal limits.
Impression

 Severe fetal growth restriction.  On previous fetal growth
 assessment, the estimated fetal weight was at the 3rd
 percentile.
 Patient returned for antenatal testing.
 Amniotic fluid is normal and good fetal activity seen.
 Umbilical artery Doppler showed normal forward diastolic
 flow.  NST is reactive.  BPP [DATE].
 Patient has an appointment for fetal growth assessment next
 week.  If severe fetal growth restriction persists, we will
 recommend delivery at 37 weeks gestation. If she does not
 have severe fetal growth restriction, delivery at 38 or 39
 weeks' gestation is reasonable.
 Patient has a prenatal visit appointment on 06/12/21.
Recommendations

 Fetal growth assessment and antenatal testing next week.
                Chucks, Jupark

## 2023-02-06 ENCOUNTER — Other Ambulatory Visit: Payer: Self-pay

## 2023-02-06 ENCOUNTER — Encounter (HOSPITAL_COMMUNITY): Payer: Self-pay

## 2023-02-06 ENCOUNTER — Emergency Department (HOSPITAL_COMMUNITY)
Admission: EM | Admit: 2023-02-06 | Discharge: 2023-02-06 | Disposition: A | Discharge status: Home / Self Care | Payer: Medicaid Other | Attending: Emergency Medicine | Admitting: Emergency Medicine

## 2023-02-06 DIAGNOSIS — S61216A Laceration without foreign body of right little finger without damage to nail, initial encounter: Secondary | ICD-10-CM | POA: Insufficient documentation

## 2023-02-06 DIAGNOSIS — W260XXA Contact with knife, initial encounter: Secondary | ICD-10-CM | POA: Diagnosis not present

## 2023-02-06 DIAGNOSIS — Z23 Encounter for immunization: Secondary | ICD-10-CM | POA: Diagnosis not present

## 2023-02-06 MED ORDER — LIDOCAINE HCL (PF) 1 % IJ SOLN
30.0000 mL | Freq: Once | INTRAMUSCULAR | Status: AC
  Administered 2023-02-06: 30 mL
  Filled 2023-02-06: qty 30

## 2023-02-06 MED ORDER — TETANUS-DIPHTH-ACELL PERTUSSIS 5-2.5-18.5 LF-MCG/0.5 IM SUSY
0.5000 mL | PREFILLED_SYRINGE | Freq: Once | INTRAMUSCULAR | Status: AC
Start: 2023-02-06 — End: 2023-02-06
  Administered 2023-02-06: 0.5 mL via INTRAMUSCULAR
  Filled 2023-02-06: qty 0.5
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
## 2023-02-19 ENCOUNTER — Ambulatory Visit (HOSPITAL_COMMUNITY)
Admission: RE | Admit: 2023-02-19 | Discharge: 2023-02-19 | Disposition: A | Discharge status: Home / Self Care | Payer: Medicaid Other | Source: Ambulatory Visit | Attending: Emergency Medicine

## 2023-02-19 ENCOUNTER — Ambulatory Visit (INDEPENDENT_AMBULATORY_CARE_PROVIDER_SITE_OTHER): Payer: Medicaid Other

## 2023-02-19 ENCOUNTER — Encounter (HOSPITAL_COMMUNITY): Payer: Self-pay

## 2023-02-19 VITALS — BP 113/75 | HR 55 | Temp 98.4°F | Resp 16 | Ht 61.0 in | Wt 98.0 lb

## 2023-02-19 DIAGNOSIS — S6991XA Unspecified injury of right wrist, hand and finger(s), initial encounter: Secondary | ICD-10-CM

## 2023-02-19 DIAGNOSIS — Z4802 Encounter for removal of sutures: Secondary | ICD-10-CM
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
## 2023-02-19 NOTE — Discharge Instructions (Signed)
We removed your 2 sutures here in clinic.  Your hand seems to be healing without any signs of infection.  The images did not show any acute fractures or deformities.  Ligaments and tendons do not show up on x-rays.  Please follow-up with a hand specialist for the changes in range of motion to your fingers.  You can keep them buddy taped to help provide support.  Return to clinic for any new or urgent symptoms.

## 2023-02-19 NOTE — ED Provider Notes (Signed)
[facility]    CSN: [id number] Arrival date & time: 02/19/23  1348      History   Chief Complaint Chief Complaint  Patient presents with   Hand Injury   Suture / Staple Removal    HPI Paige Young is a 27 y.o. female.   Patient was seen in the ED for laceration of her right pinky finger that occurred on 11/30.  Had Tdap updated and 2 4-0 sutures placed.  Was told to return in 10 days, around 12/10 suture removal.  She presents today for suture removal, no issues with the sutures or wound healing.  She is concerned because she is not able to fully bend her fourth and fifth fingers.  She with like an x-ray.  Denies any punching, crushing or new injuries to the hand.  Endorses that she had a small laceration to her fourth finger that was not sutured at the time of her ER visit. No pain.   The history is provided by the patient and medical records.    Past Medical History:  Diagnosis Date   Asthma    Seasonal allergies     Patient Active Problem List   Diagnosis Date Noted   Cesarean delivery delivered 06/30/2021   Fetal growth restriction antepartum 05/15/2021   History of asthma 02/05/2021   Supervision of high-risk pregnancy 02/04/2021   Major depression 06/01/2013    Past Surgical History:  Procedure Laterality Date   CESAREAN SECTION N/A 06/30/2021   Procedure: CESAREAN SECTION;  Surgeon: Tereso Newcomer, MD;  Location: [facility];  Service: Obstetrics;  Laterality: N/A;    OB History     Gravida  1   Para  1   Term  1   Preterm      AB      Living  1      SAB      IAB      Ectopic      Multiple  0   Live Births  1            Home Medications    Prior to Admission medications   Medication Sig Start Date End Date Taking? Authorizing Provider  acetaminophen (TYLENOL) 500 MG tablet Take 2 tablets (1,000 mg total) by mouth every 6 (six) hours. Patient not taking: Reported on 07/10/2021 07/02/21   Warner Mccreedy, MD   albuterol (VENTOLIN HFA) 108 (90 Base) MCG/ACT inhaler Inhale 2 puffs into the lungs every 6 (six) hours as needed for wheezing or shortness of breath. Patient not taking: Reported on 07/10/2021 02/05/21   Nugent, Odie Sera, NP  Blood Pressure Monitoring (BLOOD PRESSURE KIT) DEVI 1 kit by Does not apply route once a week. Check Blood Pressure regularly and record readings into the [employer] App.  Large Cuff.  DX O90.0 Patient not taking: Reported on 07/10/2021 06/12/21   Constant, Peggy, MD  ferrous sulfate 325 (65 FE) MG tablet Take 1 tablet (325 mg total) by mouth every other day. Patient not taking: Reported on 07/10/2021 07/03/21 09/01/21  Warner Mccreedy, MD  furosemide (LASIX) 20 MG tablet Take 1 tablet (20 mg total) by mouth daily for 4 days. 07/02/21 07/06/21  Warner Mccreedy, MD  furosemide (LASIX) 20 MG tablet Take 1 tablet (20 mg total) by mouth 2 (two) times daily for 4 days. 07/10/21 07/14/21  Warden Fillers, MD  ibuprofen (ADVIL) 600 MG tablet Take 1 tablet (600 mg total) by mouth every 6 (six) hours. 07/02/21  Warner Mccreedy, MD  polyethylene glycol (MIRALAX / GLYCOLAX) 17 g packet Take 17 g by mouth daily. Patient not taking: Reported on 07/10/2021 07/02/21   Warner Mccreedy, MD  Prenatal-Fe Fum-Methf-FA w/o A (VITAFOL-NANO) 18-0.6-0.4 MG TABS Take 1 tablet by mouth daily. 01/28/21   Adam Phenix, MD    Family History Family History  Problem Relation Age of Onset   Asthma Mother    Hypertension Father     Social History Social History   Tobacco Use   Smoking status: Some Days    Types: Cigarettes   Smokeless tobacco: Never  Vaping Use   Vaping status: Never Used  Substance Use Topics   Alcohol use: Not Currently    Comment: not while preg   Drug use: Not Currently    Types: Marijuana    Comment: last used 2 weeks ago as of 4/19     Allergies   Shellfish allergy   Review of Systems Review of Systems  Per HPI   Physical Exam Triage Vital Signs ED Triage Vitals  Encounter Vitals  Group     BP      Systolic BP Percentile      Diastolic BP Percentile      Pulse      Resp      Temp      Temp src      SpO2      Weight      Height      Head Circumference      Peak Flow      Pain Score      Pain Loc      Pain Education      Exclude from Growth Chart    No data found.  Updated Vital Signs BP 113/75 (BP Location: Left Arm)   Pulse (!) 55   Temp 98.4 F (36.9 C) (Oral)   Resp 16   Ht 5\' 1"  (1.549 m)   Wt 98 lb (44.5 kg)   LMP 02/09/2023 (Exact Date)   SpO2 98%   Breastfeeding No   BMI 18.52 kg/m   Visual Acuity Right Eye Distance:   Left Eye Distance:   Bilateral Distance:    Right Eye Near:   Left Eye Near:    Bilateral Near:     Physical Exam Vitals and nursing note reviewed.  Constitutional:      Appearance: Normal appearance.  HENT:     Head: Normocephalic and atraumatic.     Right Ear: External ear normal.     Left Ear: External ear normal.     Nose: Nose normal.     Mouth/Throat:     Mouth: Mucous membranes are moist.  Eyes:     Conjunctiva/sclera: Conjunctivae normal.  Cardiovascular:     Rate and Rhythm: Normal rate.     Pulses: Normal pulses.     Heart sounds: No murmur heard. Pulmonary:     Effort: Pulmonary effort is normal. No respiratory distress.  Musculoskeletal:     Right hand: Decreased range of motion. Normal strength. Normal sensation.     Comments: Disruption of finger flexion at DIP and fourth and fifth digit of right hand.  Skin:    General: Skin is warm and dry.  Neurological:     General: No focal deficit present.     Mental Status: She is alert.  Psychiatric:        Mood and Affect: Mood normal.        Behavior: Behavior  is cooperative.      UC Treatments / Results  Labs (all labs ordered are listed, but only abnormal results are displayed) Labs Reviewed - No data to display  EKG   Radiology No results found.  Procedures Procedures (including critical care time)  Medications Ordered in  UC Medications - No data to display  Initial Impression / Assessment and Plan / UC Course  I have reviewed the triage vital signs and the nursing notes.  Pertinent labs & imaging results that were available during my care of the patient were reviewed by me and considered in my medical decision making (see chart for details).  Vitals and triage reviewed, patient is hemodynamically stable.  Laceration is healing well, edges are well-approximated.  Staff to remove the 2 sutures.  Limitation to full flexion at the DIP and fourth and fifth finger of the right hand, could have injured some ligaments with the laceration.  Imaging does not reveal any acute fracture or deformity.  Encouraged to follow-up with hand specialist, provided with buddy taping for support.  Plan of care, follow-up care return precautions given, no questions at this time.     Final Clinical Impressions(s) / UC Diagnoses   Final diagnoses:  Visit for suture removal  Injury of right hand, initial encounter     Discharge Instructions      We removed your 2 sutures here in clinic.  Your hand seems to be healing without any signs of infection.  The images did not show any acute fractures or deformities.  Ligaments and tendons do not show up on x-rays.  Please follow-up with a hand specialist for the changes in range of motion to your fingers.  You can keep them buddy taped to help provide support.  Return to clinic for any new or urgent symptoms.     ED Prescriptions   None    PDMP not reviewed this encounter.   Candid Bovey, [place] N, [place] 02/19/23 [phone]

## 2023-07-08 ENCOUNTER — Encounter (HOSPITAL_COMMUNITY): Payer: Self-pay

## 2023-07-08 ENCOUNTER — Ambulatory Visit (HOSPITAL_COMMUNITY)
Admission: EM | Admit: 2023-07-08 | Discharge: 2023-07-08 | Disposition: A | Discharge status: Home / Self Care | Attending: Family Medicine | Admitting: Family Medicine

## 2023-07-08 DIAGNOSIS — Z3201 Encounter for pregnancy test, result positive: Secondary | ICD-10-CM | POA: Diagnosis not present

## 2023-07-08 DIAGNOSIS — K130 Diseases of lips: Secondary | ICD-10-CM | POA: Diagnosis not present

## 2023-07-08 LAB — POCT URINE PREGNANCY: Preg Test, Ur: POSITIVE — AB
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
## 2023-07-08 NOTE — Discharge Instructions (Signed)
 Your pregnancy test was positive.  You can take tylenol /acetaminophen  for any pain or fever.  Ice the sore swollen area.

## 2023-07-29 ENCOUNTER — Ambulatory Visit (INDEPENDENT_AMBULATORY_CARE_PROVIDER_SITE_OTHER): Admitting: Orthopedic Surgery

## 2023-07-29 DIAGNOSIS — R202 Paresthesia of skin: Secondary | ICD-10-CM | POA: Diagnosis not present

## 2023-07-29 DIAGNOSIS — R200 Anesthesia of skin: Secondary | ICD-10-CM | POA: Diagnosis not present

## 2023-07-29 NOTE — Progress Notes (Signed)
 Paige Young - 28 y.o. female MRN [id number]  Date of birth: 09/21/1995  Office Visit Note: Visit Date: 07/29/2023 PCP: Leanora Prophet, PA-C (Inactive) Referred by: Leanora Prophet, PA-C  Subjective: No chief complaint on file.  HPI: Paige Young is a pleasant 28 y.o. female who presents today for evaluation of right hand symptoms of the ring and small finger.  She sustained a laceration injury in December of last year, was seen in the emergency department setting the day of injury and underwent bedside irrigation and suturing.  She states that the laceration was located just distal to the DIP regions of the ring and small finger.  She was not seen previously by hand surgery.  States that she has had difficulty with range of motion at the DIP flexion of the ring and small finger since that time.  She is also describing numbness tingling in the ring and small finger diffusely.  She also demonstrates that the numbness and tingling can spread throughout the hand.  She describes nocturnal symptoms as well of numbness and tingling.  Pertinent ROS were reviewed with the patient and found to be negative unless otherwise specified above in HPI.   Visit Reason: right hand Duration of symptoms: December 2024 Hand dominance: right Occupation: [employer] Diabetic: No Smoking: Yes Heart/Lung History: none Blood Thinners: none  Prior Testing/EMG: December 2024 Injections (Date): none Treatments: none Prior Surgery:none   Assessment & Plan: Visit Diagnoses:  1. Numbness and tingling in right hand     Plan: Extensive discussion was had with the patient today regarding her multiple complaints of the right hand.  Based on her prior workup and documentation, it would appear that her laceration injury were distal to the flexor digitorum profundus insertion sites of the ring and small finger.  However, on examination today, she does demonstrate significant weakness with attempted DIP  flexion of both the ring and small finger indicating that there may have been a potential zone 1 flexor tendon injury.  Given the chronicity of this problem and her excellent motion at the FDS regions of both the ring and small finger, I did explain that her hand is quite functional and may not warrant any aggressive treatment.  I did explain that given the chronicity of this problem, it would be difficult for primary repair of the FDP tendons if they are in fact injured, given the excessive scarring in this region and lack of tendon excursion.  We could could explore the idea of potential graft fixation or tenodesis, however given her appropriate function of the ring and small finger, the benefits may not outweigh the risks in this case.  As for the numbness and tingling in the right hand, I did recommend a potential extra diagnostic study of the right upper extremity in order to better delineate any significant nerve compression pathology.  This is unlikely correlated with her laceration injury given that the distribution of her numbness is more proximal throughout the hand and does not really get itself simply to the ulnar nerve distribution as there is numbness and tingling to the radial aspect of the hand as well.  She expressed understanding, will obtain the right upper extremity nerve study and return to me afterwards to review results and discuss appropriate next dose.  Follow-up: No follow-ups on file.   Meds & Orders: No orders of the defined types were placed in this encounter.   Orders Placed This Encounter  Procedures   Ambulatory referral  to Physical Medicine Rehab     Procedures: No procedures performed      Clinical History: No specialty comments available.  She reports that she has been smoking cigarettes. She has never used smokeless tobacco. No results for input(s): "HGBA1C", "LABURIC" in the last 8760 hours.  Objective:   Vital Signs: LMP 05/17/2023 (Approximate)    Physical Exam  Gen: Well-appearing, in no acute distress; non-toxic CV: Regular Rate. Well-perfused. Warm.  Resp: Breathing unlabored on room air; no wheezing. Psych: Fluid speech in conversation; appropriate affect; normal thought process  Ortho Exam Right upper extremity: - Prior well-healed laceration injuries to the ring and small finger, unable to discern exact laceration site given the appropriate healing - Unable to perform active flexion at the DIP of the ring and small finger when isolated, PIP of both ring and small finger with appropriate range of motion - Able to form composite fist, DIP of the ring and small finger remain extended - Sensation appears diminished to light touch in both the ulnar and median nerve distributions of the hand - APB 5/5 without thenar atrophy, negative Froment's, AIN/PIN/interosseous intact  Imaging: No results found.  Past Medical/Family/Surgical/Social History: Medications & Allergies reviewed per EMR, new medications updated. Patient Active Problem List   Diagnosis Date Noted   Cesarean delivery delivered 06/30/2021   Fetal growth restriction antepartum 05/15/2021   History of asthma 02/05/2021   Supervision of high-risk pregnancy 02/04/2021   Major depression 06/01/2013   Past Medical History:  Diagnosis Date   Asthma    Seasonal allergies    Family History  Problem Relation Age of Onset   Asthma Mother    Hypertension Father    Past Surgical History:  Procedure Laterality Date   CESAREAN SECTION N/A 06/30/2021   Procedure: CESAREAN SECTION;  Surgeon: Julianne Octave, MD;  Location: [facility];  Service: Obstetrics;  Laterality: N/A;   Social History   Occupational History   Not on file  Tobacco Use   Smoking status: Some Days    Types: Cigarettes   Smokeless tobacco: Never  Vaping Use   Vaping status: Never Used  Substance and Sexual Activity   Alcohol use: Not Currently    Comment: not while preg   Drug use: Not  Currently    Types: Marijuana    Comment: last used 2 weeks ago as of 4/19   Sexual activity: Yes    Birth control/protection: Condom    Tiasia Weberg Merlinda Starling) Marce Sensing, M.D. [facility], Hand Surgery

## 2023-08-31 ENCOUNTER — Encounter: Admitting: Physical Medicine and Rehabilitation

## 2023-08-31 ENCOUNTER — Telehealth: Payer: Self-pay | Admitting: Physical Medicine and Rehabilitation

## 2023-08-31 NOTE — Progress Notes (Deleted)
   Paige Young - 28 y.o. female MRN [id number]  Date of birth: [birth year]  Office Visit Note: Visit Date: 08/31/2023 PCP: Buck Search, PA-C Referred by: Arlinda Buster, MD  Subjective: No chief complaint on file.  HPI: Paige Young is a 28 y.o. female who comes in today at the request of Dr. Anshul Agarwala for evaluation and management of chronic, worsening and severe pain, numbness and tingling in the Right upper extremities.  Patient is Right hand dominant.  right hand symptoms of the ring and small finger.  She sustained a laceration injury in December of last year, was seen in the emergency department setting the day of injury and underwent bedside irrigation and suturing.  She states that the laceration was located just distal to the DIP regions of the ring and small finger.   She was not seen previously by hand surgery.  States that she has had difficulty with range of motion at the DIP flexion of the ring and small finger since that time.  She is also describing numbness tingling in the ring and small finger diffusely.  She also demonstrates that the numbness and tingling can spread throughout the hand.  She describes nocturnal symptoms as well of numbness and tingling.       ROS Otherwise per HPI.  Assessment & Plan: Visit Diagnoses:    ICD-10-CM   1. Paresthesia of skin  R20.2        Plan: No additional findings.   Meds & Orders: No orders of the defined types were placed in this encounter.  No orders of the defined types were placed in this encounter.   Follow-up: No follow-ups on file.   Procedures: No procedures performed      Clinical History: No specialty comments available.   She reports that she has been smoking cigarettes. She has never used smokeless tobacco. No results for input(s): HGBA1C, LABURIC in the last 8760 hours.  Objective:  VS:  HT:    WT:   BMI:     BP:   HR: bpm  TEMP: ( )  RESP:  Physical Exam  Ortho  Exam  Imaging: No results found.  Past Medical/Family/Surgical/Social History: Medications & Allergies reviewed per EMR, new medications updated. Patient Active Problem List   Diagnosis Date Noted  . Cesarean delivery delivered 06/30/2021  . Fetal growth restriction antepartum 05/15/2021  . History of asthma 02/05/2021  . Supervision of high-risk pregnancy 02/04/2021  . Major depression 06/01/2013   Past Medical History:  Diagnosis Date  . Asthma   . Seasonal allergies    Family History  Problem Relation Age of Onset  . Asthma Mother   . Hypertension Father    Past Surgical History:  Procedure Laterality Date  . CESAREAN SECTION N/A 06/30/2021   Procedure: CESAREAN SECTION;  Surgeon: Herchel Gloris LABOR, MD;  Location: [facility];  Service: Obstetrics;  Laterality: N/A;   Social History   Occupational History  . Not on file  Tobacco Use  . Smoking status: Some Days    Types: Cigarettes  . Smokeless tobacco: Never  Vaping Use  . Vaping status: Never Used  Substance and Sexual Activity  . Alcohol use: Not Currently    Comment: not while preg  . Drug use: Not Currently    Types: Marijuana    Comment: last used 2 weeks ago as of 4/19  . Sexual activity: Yes    Birth control/protection: Condom

## 2023-08-31 NOTE — Telephone Encounter (Signed)
 Patient called and missed her appointment this morning and needs to reschedule. CB#[phone]

## 2023-10-22 ENCOUNTER — Ambulatory Visit (INDEPENDENT_AMBULATORY_CARE_PROVIDER_SITE_OTHER): Admitting: Physical Medicine and Rehabilitation

## 2023-10-22 DIAGNOSIS — R202 Paresthesia of skin: Secondary | ICD-10-CM

## 2023-10-22 DIAGNOSIS — R29898 Other symptoms and signs involving the musculoskeletal system: Secondary | ICD-10-CM

## 2023-10-22 DIAGNOSIS — S61411S Laceration without foreign body of right hand, sequela: Secondary | ICD-10-CM | POA: Diagnosis not present

## 2023-10-22 NOTE — Progress Notes (Signed)
 RUE EMG  Right handed  Doesn't really have pain, just the numbness and tingling Weakness

## 2023-10-24 NOTE — Procedures (Signed)
 EMG & NCV Findings: Evaluation of the right median motor nerve showed prolonged distal onset latency (4.5 ms) and decreased conduction velocity (Elbow-Wrist, 49 m/s).  The right median (across palm) sensory nerve showed no response (Palm) and prolonged distal peak latency (4.0 ms).  All remaining nerves (as indicated in the following tables) were within normal limits.    All examined muscles (as indicated in the following table) showed no evidence of electrical instability.    Impression: The above electrodiagnostic study is ABNORMAL and reveals evidence of a moderate right median nerve entrapment at the wrist (carpal tunnel syndrome) affecting sensory and motor components.   There is no significant electrodiagnostic evidence of any other focal nerve entrapment, brachial plexopathy or cervical radiculopathy.   Recommendations: 1.  Follow-up with referring physician. 2.  Continue current management of symptoms.  Careful clinical correlation is paramount. 3.  Continue use of resting splint at night-time and as needed during the day.  ___________________________ Prentice Eldonna BETTERS Board Certified, American Board of Physical Medicine and Rehabilitation    Nerve Conduction Studies Anti Sensory Summary Table   Stim Site NR Peak (ms) Norm Peak (ms) P-T Amp (V) Norm P-T Amp Site1 Site2 Delta-P (ms) Dist (cm) Vel (m/s) Norm Vel (m/s)  Right Median Acr Palm Anti Sensory (2nd Digit)  30.3C  Wrist    *4.0 <3.6 24.8 >10 Wrist Palm  0.0    Palm *NR  <2.0          Right Radial Anti Sensory (Base 1st Digit)  30.8C  Wrist    2.0 <3.1 66.1  Wrist Base 1st Digit 2.0 0.0    Right Ulnar Anti Sensory (5th Digit)  30.9C  Wrist    2.8 <3.7 26.7 >15.0 Wrist 5th Digit 2.8 14.0 50 >38   Motor Summary Table   Stim Site NR Onset (ms) Norm Onset (ms) O-P Amp (mV) Norm O-P Amp Site1 Site2 Delta-0 (ms) Dist (cm) Vel (m/s) Norm Vel (m/s)  Right Median Motor (Abd Poll Brev)  31.5C  Wrist    *4.5 <4.2 9.0 >5  Elbow Wrist 3.9 19.0 *49 >50  Elbow    8.4  8.2         Right Ulnar Motor (Abd Dig Min)  32C  Wrist    2.7 <4.2 8.8 >3 B Elbow Wrist 2.8 18.0 64 >53  B Elbow    5.5  9.4  A Elbow B Elbow 1.1 9.0 82 >53  A Elbow    6.6  9.2          EMG   Side Muscle Nerve Root Ins Act Fibs Psw Amp Dur Poly Recrt Int Bruna Comment  Right Abd Poll Brev Median C8-T1 Nml Nml Nml Nml Nml 0 Nml Nml   Right 1stDorInt Ulnar C8-T1 Nml Nml Nml Nml Nml 0 Nml Nml   Right PronatorTeres Median C6-7 Nml Nml Nml Nml Nml 0 Nml Nml   Right Biceps Musculocut C5-6 Nml Nml Nml Nml Nml 0 Nml Nml   Right Deltoid Axillary C5-6 Nml Nml Nml Nml Nml 0 Nml Nml     Nerve Conduction Studies Anti Sensory Left/Right Comparison   Stim Site L Lat (ms) R Lat (ms) L-R Lat (ms) L Amp (V) R Amp (V) L-R Amp (%) Site1 Site2 L Vel (m/s) R Vel (m/s) L-R Vel (m/s)  Median Acr Palm Anti Sensory (2nd Digit)  30.3C  Wrist  *4.0   24.8  Wrist [employer]  Radial Anti Sensory (Base 1st Digit)  30.8C  Wrist  2.0   66.1  Wrist Base 1st Digit     Ulnar Anti Sensory (5th Digit)  30.9C  Wrist  2.8   26.7  Wrist 5th Digit  50    Motor Left/Right Comparison   Stim Site L Lat (ms) R Lat (ms) L-R Lat (ms) L Amp (mV) R Amp (mV) L-R Amp (%) Site1 Site2 L Vel (m/s) R Vel (m/s) L-R Vel (m/s)  Median Motor (Abd Poll Brev)  31.5C  Wrist  *4.5   9.0  Elbow Wrist  *49   Elbow  8.4   8.2        Ulnar Motor (Abd Dig Min)  32C  Wrist  2.7   8.8  B Elbow Wrist  64   B Elbow  5.5   9.4  A Elbow B Elbow  82   A Elbow  6.6   9.2           Waveforms:

## 2023-11-01 ENCOUNTER — Encounter: Payer: Self-pay | Admitting: Physical Medicine and Rehabilitation

## 2023-11-01 NOTE — Progress Notes (Signed)
 Paige Young - 28 y.o. female MRN [id number]  Date of birth: [birth year]  Office Visit Note: Visit Date: 10/22/2023 PCP: Buck Search, PA-C Referred by: Arlinda Buster, MD  Subjective: Chief Complaint  Patient presents with   Right Hand - Numbness, Weakness   HPI: Paige Young is a 28 y.o. female who comes in today at the request of Dr. Anshul Agarwala for evaluation and management of chronic, worsening numbness and tingling in the Right upper extremities.  Patient is Right hand dominant.  She reports laceration to the right hand in December of last year.  This was treated in the emergency room.  More recently she saw Dr. Arlinda with history of weakness in the 4th and 5th digit with numbness and tingling more diffusely through the whole hand.  She denies any frank radicular symptoms down the arm into the hand.  No significant neck pain.  No history of neuropathy.  No history of diabetes.  She has no symptoms on the left side.  She has tried and failed medications in time and activity modification.  She does get some nocturnal complaints but also symptoms during the day.  No real pain involvement just mainly tingling numbness and weakness.   I spent more than 30 minutes speaking face-to-face with the patient with 50% of the time in counseling and discussing coordination of care.      Review of Systems  Neurological:  Positive for tingling and focal weakness.  All other systems reviewed and are negative.  Otherwise per HPI.  Assessment & Plan: Visit Diagnoses:    ICD-10-CM   1. Paresthesia of skin  R20.2 NCV with EMG (electromyography)    2. Weakness of right hand  R29.898     3. Laceration of right hand, foreign body presence unspecified, sequela  S61.411S        Plan: Impression: Clinically complicated picture with laceration and weakness but also with paresthesia.  Electrodiagnostic study performed.  The above electrodiagnostic study is ABNORMAL and reveals  evidence of a moderate right median nerve entrapment at the wrist (carpal tunnel syndrome) affecting sensory and motor components.   There is no significant electrodiagnostic evidence of any other focal nerve entrapment, brachial plexopathy or cervical radiculopathy.   Recommendations: 1.  Follow-up with referring physician. 2.  Continue current management of symptoms.  Careful clinical correlation is paramount. 3.  Continue use of resting splint at night-time and as needed during the day.  Meds & Orders: No orders of the defined types were placed in this encounter.   Orders Placed This Encounter  Procedures   NCV with EMG (electromyography)    Follow-up: Return for Anshul Agarwala, MD.   Procedures: No procedures performed  EMG & NCV Findings: Evaluation of the right median motor nerve showed prolonged distal onset latency (4.5 ms) and decreased conduction velocity (Elbow-Wrist, 49 m/s).  The right median (across palm) sensory nerve showed no response (Palm) and prolonged distal peak latency (4.0 ms).  All remaining nerves (as indicated in the following tables) were within normal limits.    All examined muscles (as indicated in the following table) showed no evidence of electrical instability.    Impression: The above electrodiagnostic study is ABNORMAL and reveals evidence of a moderate right median nerve entrapment at the wrist (carpal tunnel syndrome) affecting sensory and motor components.   There is no significant electrodiagnostic evidence of any other focal nerve entrapment, brachial plexopathy or cervical radiculopathy.   Recommendations: 1.  Follow-up with referring  physician. 2.  Continue current management of symptoms.  Careful clinical correlation is paramount. 3.  Continue use of resting splint at night-time and as needed during the day.  ___________________________ Prentice Eldonna BETTERS Board Certified, American Board of Physical Medicine and  Rehabilitation    Nerve Conduction Studies Anti Sensory Summary Table   Stim Site NR Peak (ms) Norm Peak (ms) P-T Amp (V) Norm P-T Amp Site1 Site2 Delta-P (ms) Dist (cm) Vel (m/s) Norm Vel (m/s)  Right Median Acr Palm Anti Sensory (2nd Digit)  30.3C  Wrist    *4.0 <3.6 24.8 >10 Wrist Palm  0.0    Palm *NR  <2.0          Right Radial Anti Sensory (Base 1st Digit)  30.8C  Wrist    2.0 <3.1 66.1  Wrist Base 1st Digit 2.0 0.0    Right Ulnar Anti Sensory (5th Digit)  30.9C  Wrist    2.8 <3.7 26.7 >15.0 Wrist 5th Digit 2.8 14.0 50 >38   Motor Summary Table   Stim Site NR Onset (ms) Norm Onset (ms) O-P Amp (mV) Norm O-P Amp Site1 Site2 Delta-0 (ms) Dist (cm) Vel (m/s) Norm Vel (m/s)  Right Median Motor (Abd Poll Brev)  31.5C  Wrist    *4.5 <4.2 9.0 >5 Elbow Wrist 3.9 19.0 *49 >50  Elbow    8.4  8.2         Right Ulnar Motor (Abd Dig Min)  32C  Wrist    2.7 <4.2 8.8 >3 B Elbow Wrist 2.8 18.0 64 >53  B Elbow    5.5  9.4  A Elbow B Elbow 1.1 9.0 82 >53  A Elbow    6.6  9.2          EMG   Side Muscle Nerve Root Ins Act Fibs Psw Amp Dur Poly Recrt Int Bruna Comment  Right Abd Poll Brev Median C8-T1 Nml Nml Nml Nml Nml 0 Nml Nml   Right 1stDorInt Ulnar C8-T1 Nml Nml Nml Nml Nml 0 Nml Nml   Right PronatorTeres Median C6-7 Nml Nml Nml Nml Nml 0 Nml Nml   Right Biceps Musculocut C5-6 Nml Nml Nml Nml Nml 0 Nml Nml   Right Deltoid Axillary C5-6 Nml Nml Nml Nml Nml 0 Nml Nml     Nerve Conduction Studies Anti Sensory Left/Right Comparison   Stim Site L Lat (ms) R Lat (ms) L-R Lat (ms) L Amp (V) R Amp (V) L-R Amp (%) Site1 Site2 L Vel (m/s) R Vel (m/s) L-R Vel (m/s)  Median Acr Palm Anti Sensory (2nd Digit)  30.3C  Wrist  *4.0   24.8  Wrist Palm     Palm             Radial Anti Sensory (Base 1st Digit)  30.8C  Wrist  2.0   66.1  Wrist Base 1st Digit     Ulnar Anti Sensory (5th Digit)  30.9C  Wrist  2.8   26.7  Wrist 5th Digit  50    Motor Left/Right Comparison   Stim Site L Lat  (ms) R Lat (ms) L-R Lat (ms) L Amp (mV) R Amp (mV) L-R Amp (%) Site1 Site2 L Vel (m/s) R Vel (m/s) L-R Vel (m/s)  Median Motor (Abd Poll Brev)  31.5C  Wrist  *4.5   9.0  Elbow Wrist  *49   Elbow  8.4   8.2        Ulnar Motor (Abd Dig Min)  32C  Wrist  2.7   8.8  B Elbow Wrist  64   B Elbow  5.5   9.4  A Elbow B Elbow  82   A Elbow  6.6   9.2           Waveforms:            Clinical History: No specialty comments available.   She reports that she has been smoking cigarettes. She has never used smokeless tobacco. No results for input(s): HGBA1C, LABURIC in the last 8760 hours.  Objective:  VS:  HT:    WT:   BMI:     BP:   HR: bpm  TEMP: ( )  RESP:  Physical Exam Vitals and nursing note reviewed.  Constitutional:      General: She is not in acute distress.    Appearance: Normal appearance. She is well-developed. She is not ill-appearing.  HENT:     Head: Normocephalic and atraumatic.  Eyes:     Conjunctiva/sclera: Conjunctivae normal.     Pupils: Pupils are equal, round, and reactive to light.  Cardiovascular:     Rate and Rhythm: Normal rate.     Pulses: Normal pulses.  Pulmonary:     Effort: Pulmonary effort is normal.  Musculoskeletal:        General: No swelling, tenderness or deformity.     Right lower leg: No edema.     Left lower leg: No edema.     Comments: Inspection reveals no atrophy of the bilateral APB or FDI or hand intrinsics. There is no swelling, color changes, allodynia or dystrophic changes. There is 5 out of 5 strength in the bilateral wrist extension, finger abduction but unable to flex the DIP joint of the right 4th and 5th digit. There is intact sensation to light touch in all dermatomal and peripheral nerve distributions. There is a negative Froment's test bilaterally. There is a negative Tinel's test at the bilateral wrist and elbow.  Equivocally positive Phalen's on the right. There is a negative Hoffmann's test bilaterally.  Skin:     General: Skin is warm and dry.     Findings: No erythema or rash.  Neurological:     General: No focal deficit present.     Mental Status: She is alert and oriented to person, place, and time.     Cranial Nerves: No cranial nerve deficit.     Sensory: No sensory deficit.     Motor: Weakness present. No abnormal muscle tone.     Coordination: Coordination normal.     Gait: Gait normal.  Psychiatric:        Mood and Affect: Mood normal.        Behavior: Behavior normal.     Ortho Exam  Imaging: No results found.  Past Medical/Family/Surgical/Social History: Medications & Allergies reviewed per EMR, new medications updated. Patient Active Problem List   Diagnosis Date Noted   Cesarean delivery delivered 06/30/2021   Fetal growth restriction antepartum 05/15/2021   History of asthma 02/05/2021   Supervision of high-risk pregnancy 02/04/2021   Major depression 06/01/2013   Past Medical History:  Diagnosis Date   Asthma    Seasonal allergies    Family History  Problem Relation Age of Onset   Asthma Mother    Hypertension Father    Past Surgical History:  Procedure Laterality Date   CESAREAN SECTION N/A 06/30/2021   Procedure: CESAREAN SECTION;  Surgeon: Herchel Gloris LABOR, MD;  Location: [facility];  Service: Obstetrics;  Laterality: N/A;   Social History   Occupational History   Not on file  Tobacco Use   Smoking status: Some Days    Types: Cigarettes   Smokeless tobacco: Never  Vaping Use   Vaping status: Never Used  Substance and Sexual Activity   Alcohol use: Not Currently    Comment: not while preg   Drug use: Not Currently    Types: Marijuana    Comment: last used 2 weeks ago as of 4/19   Sexual activity: Yes    Birth control/protection: Condom

## 2023-11-02 ENCOUNTER — Ambulatory Visit: Admitting: Orthopedic Surgery

## 2023-11-03 ENCOUNTER — Ambulatory Visit (INDEPENDENT_AMBULATORY_CARE_PROVIDER_SITE_OTHER): Admitting: Orthopedic Surgery

## 2023-11-03 DIAGNOSIS — G5601 Carpal tunnel syndrome, right upper limb: Secondary | ICD-10-CM | POA: Diagnosis not present

## 2023-11-03 NOTE — Progress Notes (Signed)
 Paige Young - 28 y.o. female MRN [id number]  Date of birth: 10/21/1995  Office Visit Note: Visit Date: 11/03/2023 PCP: Buck Search, PA-C Referred by: Buck Search, PA-C  Subjective: No chief complaint on file.  HPI: Paige Young is a pleasant 28 y.o. female who returns today for evaluation of right hand symptoms numbness and tingling.  She had sustained a laceration injury to the zone 1 flexors of the ring and small finger in December of last year, however she was seen subacutely and was managed nonop.  She continues to demonstrate appropriate flexion at the PIPs of the ring and small finger without active flexion of the DIP regions.  She did undergo recent EMG study of the right hand as instructed which did show moderate carpal tunnel syndrome.  She has not undergone any significant treatment for this previously.  Pertinent ROS were reviewed with the patient and found to be negative unless otherwise specified above in HPI.     Assessment & Plan: Visit Diagnoses:  1. Carpal tunnel syndrome, right upper limb      Plan: Extensive discussion was had with the patient today about her ongoing right sided carpal tunnel syndrome.  We discussed the underlying etiology and pathophysiology of this condition.  Patient has both clinical and electrodiagnostic evidence to confirm this diagnosis.  At this juncture, given that she has not undergone any conservative measures, we will begin with bracing particular for her nocturnal symptoms.  She will return in approximate 6 weeks for recheck of her symptoms and appropriate discussion of next steps.  We did discuss the possibility of open versus endoscopic carpal tunnel release in the future should symptoms remain refractory to conservative care. Risks and benefits of both operations were discussed in detail today.    Risks include but not limited to infection, bleeding, scarring, stiffness, nerve injury or vascular, tendon injury,  risk of recurrence and need for subsequent operation were all discussed in detail.    Follow-up: No follow-ups on file.   Meds & Orders: No orders of the defined types were placed in this encounter.   No orders of the defined types were placed in this encounter.    Procedures: No procedures performed      Clinical History: No specialty comments available.  She reports that she has been smoking cigarettes. She has never used smokeless tobacco. No results for input(s): HGBA1C, LABURIC in the last 8760 hours.  Objective:   Vital Signs: There were no vitals taken for this visit.  Physical Exam  Gen: Well-appearing, in no acute distress; non-toxic CV: Regular Rate. Well-perfused. Warm.  Resp: Breathing unlabored on room air; no wheezing. Psych: Fluid speech in conversation; appropriate affect; normal thought process  Ortho Exam Right upper extremity: - Prior well-healed laceration injuries to the ring and small finger, unable to discern exact laceration site given the appropriate healing - Unable to perform active flexion at the DIP of the ring and small finger when isolated, PIP of both ring and small finger with appropriate range of motion - Able to form composite fist, DIP of the ring and small finger remain extended - Sensation diminished to light touch in median nerve distribution - APB 5/5 without thenar atrophy, negative Froment's, AIN/PIN/interosseous intact  Imaging: No results found.  Past Medical/Family/Surgical/Social History: Medications & Allergies reviewed per EMR, new medications updated. Patient Active Problem List   Diagnosis Date Noted   Cesarean delivery delivered 06/30/2021   Fetal growth restriction antepartum 05/15/2021  History of asthma 02/05/2021   Supervision of high-risk pregnancy 02/04/2021   Major depression 06/01/2013   Past Medical History:  Diagnosis Date   Asthma    Seasonal allergies    Family History  Problem Relation Age of  Onset   Asthma Mother    Hypertension Father    Past Surgical History:  Procedure Laterality Date   CESAREAN SECTION N/A 06/30/2021   Procedure: CESAREAN SECTION;  Surgeon: Herchel Gloris LABOR, MD;  Location: [facility];  Service: Obstetrics;  Laterality: N/A;   Social History   Occupational History   Not on file  Tobacco Use   Smoking status: Some Days    Types: Cigarettes   Smokeless tobacco: Never  Vaping Use   Vaping status: Never Used  Substance and Sexual Activity   Alcohol use: Not Currently    Comment: not while preg   Drug use: Not Currently    Types: Marijuana    Comment: last used 2 weeks ago as of 4/19   Sexual activity: Yes    Birth control/protection: Condom    Mileidy Atkin Estela) Arlinda, M.D. [facility], Hand Surgery

## 2023-12-15 ENCOUNTER — Ambulatory Visit: Admitting: Orthopedic Surgery

## 2023-12-15 DIAGNOSIS — G5601 Carpal tunnel syndrome, right upper limb: Secondary | ICD-10-CM

## 2023-12-15 MED ORDER — LIDOCAINE HCL 1 % IJ SOLN
1.0000 mL | INTRAMUSCULAR | Status: AC | PRN
  Administered 2023-12-15: 1 mL

## 2023-12-15 MED ORDER — BETAMETHASONE SOD PHOS & ACET 6 (3-3) MG/ML IJ SUSP
6.0000 mg | INTRAMUSCULAR | Status: AC | PRN
  Administered 2023-12-15: 6 mg via INTRA_ARTICULAR

## 2023-12-15 NOTE — Progress Notes (Signed)
 Paige Young - 28 y.o. female MRN [id number]  Date of birth: [birth year]  Office Visit Note: Visit Date: 12/15/2023 PCP: Buck Search, PA-C Referred by: Buck Search, PA-C  Subjective: No chief complaint on file.  HPI: Paige Young is a pleasant 28 y.o. female who returns today for evaluation of right hand symptoms numbness and tingling.  She had sustained a laceration injury to the zone 1 flexors of the ring and small finger in December of last year, however she was seen subacutely and was managed nonop.  She continues to demonstrate appropriate flexion at the PIPs of the ring and small finger without active flexion of the DIP regions.  She did undergo recent EMG study of the right hand as instructed which did show moderate carpal tunnel syndrome.  She has trialed bracing particular for nocturnal symptoms without lasting relief.  Pertinent ROS were reviewed with the patient and found to be negative unless otherwise specified above in HPI.     Assessment & Plan: Visit Diagnoses:  1. Carpal tunnel syndrome, right upper limb       Plan: Extensive discussion was had with the patient today about her ongoing right sided carpal tunnel syndrome.  We once again discussed the underlying etiology and pathophysiology of this condition.  Patient has both clinical and electrodiagnostic evidence to confirm this diagnosis.  At this juncture, given that she has symptoms refractory to conservative care with bracing, I discussed that she would be appropriate candidate for cortisone injection today.  We also did discuss the possibility of open versus endoscopic carpal tunnel release in the future should symptoms remain refractory to conservative care. Risks and benefits of both operations were discussed in detail today.    For the time being, she would like to move forward cortisone injection to the right carpal tunnel region for diagnostic and therapeutic value.  Injection was  provided today under ultrasound guidance and tolerated well.  Patient will return in approximate 6 weeks for recheck.  I spent 30 minutes in the care of this patient today including review of previous documentation, imaging obtained, face-to-face time discussing all options regarding treatment and documenting the encounter.   Follow-up: No follow-ups on file.   Meds & Orders: No orders of the defined types were placed in this encounter.   No orders of the defined types were placed in this encounter.    Procedures: Hand/UE Inj: R carpal tunnel for carpal tunnel syndrome on 12/15/2023 2:33 PM Indications: therapeutic and diagnostic Details: 25 G needle, ultrasound-guided Medications: 1 mL lidocaine  1 %; 6 mg betamethasone acetate-betamethasone sodium phosphate  6 (3-3) MG/ML Outcome: tolerated well, no immediate complications  Images saved in the [facility] ultrasound database Procedure, treatment alternatives, risks and benefits explained, specific risks discussed.          Clinical History: No specialty comments available.  She reports that she has been smoking cigarettes. She has never used smokeless tobacco. No results for input(s): HGBA1C, LABURIC in the last 8760 hours.  Objective:   Vital Signs: There were no vitals taken for this visit.  Physical Exam  Gen: Well-appearing, in no acute distress; non-toxic CV: Regular Rate. Well-perfused. Warm.  Resp: Breathing unlabored on room air; no wheezing. Psych: Fluid speech in conversation; appropriate affect; normal thought process  Ortho Exam Right upper extremity: - Prior well-healed laceration injuries to the ring and small finger, unable to discern exact laceration site given the appropriate healing - Unable to perform active flexion at the  DIP of the ring and small finger when isolated, PIP of both ring and small finger with appropriate range of motion - Able to form composite fist, DIP of the ring and small finger  remain extended - Sensation diminished to light touch in median nerve distribution - APB 5/5 without thenar atrophy, negative Froment's, AIN/PIN/interosseous intact  Imaging: No results found.  Past Medical/Family/Surgical/Social History: Medications & Allergies reviewed per EMR, new medications updated. Patient Active Problem List   Diagnosis Date Noted   Cesarean delivery delivered 06/30/2021   Fetal growth restriction antepartum 05/15/2021   History of asthma 02/05/2021   Supervision of high-risk pregnancy 02/04/2021   Major depression 06/01/2013   Past Medical History:  Diagnosis Date   Asthma    Seasonal allergies    Family History  Problem Relation Age of Onset   Asthma Mother    Hypertension Father    Past Surgical History:  Procedure Laterality Date   CESAREAN SECTION N/A 06/30/2021   Procedure: CESAREAN SECTION;  Surgeon: Herchel Gloris LABOR, MD;  Location: [facility];  Service: Obstetrics;  Laterality: N/A;   Social History   Occupational History   Not on file  Tobacco Use   Smoking status: Some Days    Types: Cigarettes   Smokeless tobacco: Never  Vaping Use   Vaping status: Never Used  Substance and Sexual Activity   Alcohol use: Not Currently    Comment: not while preg   Drug use: Not Currently    Types: Marijuana    Comment: last used 2 weeks ago as of 4/19   Sexual activity: Yes    Birth control/protection: Condom    Aland Chestnutt Estela) Arlinda, M.D. [facility], Hand Surgery

## 2024-01-10 ENCOUNTER — Encounter: Payer: Self-pay | Admitting: Radiology

## 2024-01-26 ENCOUNTER — Ambulatory Visit: Admitting: Orthopedic Surgery

## 2024-01-26 DIAGNOSIS — G5601 Carpal tunnel syndrome, right upper limb: Secondary | ICD-10-CM

## 2024-01-26 NOTE — Progress Notes (Signed)
 Paige Young - 28 y.o. female MRN [id number]  Date of birth: 09-04-1995  Office Visit Note: Visit Date: 01/26/2024 PCP: Buck Search, PA-C Referred by: Buck Search, PA-C  Subjective: No chief complaint on file.  HPI: Paige Young is a pleasant 28 y.o. female who returns today for evaluation of right hand symptoms numbness and tingling.  She had sustained a laceration injury to the zone 1 flexors of the ring and small finger in December of last year, however she was seen subacutely and was managed nonop.  She continues to demonstrate appropriate flexion at the PIPs of the ring and small finger without active flexion of the DIP regions.  She did undergo recent EMG study of the right hand as instructed which did show moderate carpal tunnel syndrome.  She has trialed bracing particular for nocturnal symptoms without lasting relief.  She underwent cortisone injection in October of this year with moderate relief which has since worn off, her symptoms have returned and she is interested in surgical intervention.  Pertinent ROS were reviewed with the patient and found to be negative unless otherwise specified above in HPI.     Assessment & Plan: Visit Diagnoses:  No diagnosis found.     Plan: Extensive discussion was had with the patient today about her ongoing right sided carpal tunnel syndrome that is refractory to conservative care.  Patient has both clinical and electrodiagnostic evidence to confirm this diagnosis.  At this juncture, she is indicated for right open versus endoscopic carpal tunnel release.  Risks and benefits of both operations were discussed in detail today.  Understanding all risks and benefits, patient would like to have surgery done in the form of right endoscopic carpal tunnel release at the next available date.  Risks include but not limited to infection, bleeding, scarring, stiffness, nerve injury or vascular, tendon injury, risk of recurrence  and need for subsequent operation were all discussed in detail.  I did also explain the importance of smoking cessation around the surgical timeframe to limit complications.  Patient consented understanding the above.  Will move forward surgical scheduling.    Follow-up: No follow-ups on file.   Meds & Orders: No orders of the defined types were placed in this encounter.   No orders of the defined types were placed in this encounter.    Procedures: No procedures performed      Clinical History: No specialty comments available.  She reports that she has been smoking cigarettes. She has never used smokeless tobacco. No results for input(s): HGBA1C, LABURIC in the last 8760 hours.  Objective:   Vital Signs: There were no vitals taken for this visit.  Physical Exam  Gen: Well-appearing, in no acute distress; non-toxic CV: Regular Rate. Well-perfused. Warm.  Resp: Breathing unlabored on room air; no wheezing. Psych: Fluid speech in conversation; appropriate affect; normal thought process  Ortho Exam Right upper extremity: - Prior well-healed laceration injuries to the ring and small finger, unable to discern exact laceration site given the appropriate healing - Unable to perform active flexion at the DIP of the ring and small finger when isolated, PIP of both ring and small finger with appropriate range of motion - Able to form composite fist, DIP of the ring and small finger remain extended - Sensation diminished to light touch in median nerve distribution - APB 5/5 without thenar atrophy, negative Froment's, AIN/PIN/interosseous intact  Imaging: No results found.  Past Medical/Family/Surgical/Social History: Medications & Allergies reviewed per EMR,  new medications updated. Patient Active Problem List   Diagnosis Date Noted   Cesarean delivery delivered 06/30/2021   Fetal growth restriction antepartum 05/15/2021   History of asthma 02/05/2021   Supervision of high-risk  pregnancy 02/04/2021   Major depression 06/01/2013   Past Medical History:  Diagnosis Date   Asthma    Seasonal allergies    Family History  Problem Relation Age of Onset   Asthma Mother    Hypertension Father    Past Surgical History:  Procedure Laterality Date   CESAREAN SECTION N/A 06/30/2021   Procedure: CESAREAN SECTION;  Surgeon: Herchel Gloris LABOR, MD;  Location: [facility];  Service: Obstetrics;  Laterality: N/A;   Social History   Occupational History   Not on file  Tobacco Use   Smoking status: Some Days    Types: Cigarettes   Smokeless tobacco: Never  Vaping Use   Vaping status: Never Used  Substance and Sexual Activity   Alcohol use: Not Currently    Comment: not while preg   Drug use: Not Currently    Types: Marijuana    Comment: last used 2 weeks ago as of 4/19   Sexual activity: Yes    Birth control/protection: Condom    Noriko Macari Estela) Arlinda, M.D.  [facility], Hand Surgery

## 2024-02-01 ENCOUNTER — Other Ambulatory Visit: Payer: Self-pay

## 2024-02-23 ENCOUNTER — Other Ambulatory Visit: Payer: Self-pay

## 2024-02-23 DIAGNOSIS — G5601 Carpal tunnel syndrome, right upper limb: Secondary | ICD-10-CM

## 2024-02-28 ENCOUNTER — Encounter (HOSPITAL_BASED_OUTPATIENT_CLINIC_OR_DEPARTMENT_OTHER): Payer: Self-pay | Admitting: Orthopedic Surgery

## 2024-02-28 ENCOUNTER — Other Ambulatory Visit: Payer: Self-pay

## 2024-03-09 NOTE — Anesthesia Preprocedure Evaluation (Signed)
"                                    Anesthesia Evaluation  Patient identified by MRN, date of birth, ID band Patient awake    Reviewed: Allergy & Precautions, NPO status , Patient's Chart, lab work & pertinent test results  History of Anesthesia Complications Negative for: history of anesthetic complications  Airway Mallampati: II  TM Distance: >3 FB Neck ROM: Full    Dental no notable dental hx.    Pulmonary asthma , Current SmokerPatient did not abstain from smoking.   Pulmonary exam normal        Cardiovascular negative cardio ROS Normal cardiovascular exam     Neuro/Psych    Depression       GI/Hepatic negative GI ROS, Neg liver ROS,,,  Endo/Other  negative endocrine ROS    Renal/GU negative Renal ROS     Musculoskeletal RIGHT CARPAL TUNNEL SYNDROME   Abdominal   Peds  Hematology negative hematology ROS (+)   Anesthesia Other Findings   Reproductive/Obstetrics                              Anesthesia Physical Anesthesia Plan  ASA: 2  Anesthesia Plan: General   Post-op Pain Management: Tylenol  PO (pre-op)*   Induction: Intravenous  PONV Risk Score and Plan: 3 and Treatment may vary due to age or medical condition, Ondansetron , Dexamethasone , Midazolam  and Scopolamine  patch - Pre-op  Airway Management Planned: LMA  Additional Equipment: None  Intra-op Plan:   Post-operative Plan: Extubation in OR  Informed Consent: I have reviewed the patients History and Physical, chart, labs and discussed the procedure including the risks, benefits and alternatives for the proposed anesthesia with the patient or authorized representative who has indicated his/her understanding and acceptance.     Dental advisory given  Plan Discussed with: CRNA  Anesthesia Plan Comments:          Anesthesia Quick Evaluation  "

## 2024-03-10 ENCOUNTER — Ambulatory Visit (HOSPITAL_BASED_OUTPATIENT_CLINIC_OR_DEPARTMENT_OTHER): Admitting: Anesthesiology

## 2024-03-10 ENCOUNTER — Ambulatory Visit (HOSPITAL_BASED_OUTPATIENT_CLINIC_OR_DEPARTMENT_OTHER)
Admission: RE | Admit: 2024-03-10 | Discharge: 2024-03-10 | Disposition: A | Discharge status: Home / Self Care | Attending: Orthopedic Surgery | Admitting: Orthopedic Surgery

## 2024-03-10 ENCOUNTER — Encounter (HOSPITAL_BASED_OUTPATIENT_CLINIC_OR_DEPARTMENT_OTHER): Payer: Self-pay | Admitting: Orthopedic Surgery

## 2024-03-10 ENCOUNTER — Encounter (HOSPITAL_BASED_OUTPATIENT_CLINIC_OR_DEPARTMENT_OTHER)
Admission: RE | Disposition: A | Discharge status: Home / Self Care | Payer: Self-pay | Source: Home / Self Care | Attending: Orthopedic Surgery

## 2024-03-10 ENCOUNTER — Other Ambulatory Visit: Payer: Self-pay

## 2024-03-10 DIAGNOSIS — G5601 Carpal tunnel syndrome, right upper limb: Secondary | ICD-10-CM | POA: Diagnosis not present

## 2024-03-10 DIAGNOSIS — J45909 Unspecified asthma, uncomplicated: Secondary | ICD-10-CM | POA: Diagnosis not present

## 2024-03-10 DIAGNOSIS — Z01818 Encounter for other preprocedural examination: Secondary | ICD-10-CM

## 2024-03-10 DIAGNOSIS — F17210 Nicotine dependence, cigarettes, uncomplicated: Secondary | ICD-10-CM | POA: Insufficient documentation

## 2024-03-10 HISTORY — PX: CARPAL TUNNEL RELEASE: SHX101

## 2024-03-10 LAB — POCT PREGNANCY, URINE: Preg Test, Ur: NEGATIVE

## 2024-03-10 SURGERY — RELEASE, CARPAL TUNNEL, ENDOSCOPIC
Anesthesia: General | Site: Wrist | Laterality: Right

## 2024-03-10 MED ORDER — SCOPOLAMINE 1 MG/3DAYS TD PT72
MEDICATED_PATCH | TRANSDERMAL | Status: AC
  Filled 2024-03-10: qty 1

## 2024-03-10 MED ORDER — FENTANYL CITRATE (PF) 100 MCG/2ML IJ SOLN
INTRAMUSCULAR | Status: AC
  Filled 2024-03-10: qty 2

## 2024-03-10 MED ORDER — 0.9 % SODIUM CHLORIDE (POUR BTL) OPTIME
TOPICAL | Status: DC | PRN
  Administered 2024-03-10: 1000 mL

## 2024-03-10 MED ORDER — SCOPOLAMINE 1 MG/3DAYS TD PT72
1.0000 | MEDICATED_PATCH | TRANSDERMAL | Status: DC
  Administered 2024-03-10: 1 mg via TRANSDERMAL

## 2024-03-10 MED ORDER — PROPOFOL 10 MG/ML IV BOLUS
INTRAVENOUS | Status: DC | PRN
  Administered 2024-03-10: 100 mg via INTRAVENOUS

## 2024-03-10 MED ORDER — PROPOFOL 500 MG/50ML IV EMUL
INTRAVENOUS | Status: DC | PRN
  Administered 2024-03-10: 150 ug/kg/min via INTRAVENOUS

## 2024-03-10 MED ORDER — GLYCOPYRROLATE PF 0.2 MG/ML IJ SOSY
PREFILLED_SYRINGE | INTRAMUSCULAR | Status: AC
  Filled 2024-03-10: qty 1

## 2024-03-10 MED ORDER — OXYCODONE HCL 5 MG/5ML PO SOLN: 5.0000 mg | Freq: Once | ORAL | Status: DC | PRN

## 2024-03-10 MED ORDER — ONDANSETRON HCL 4 MG/2ML IJ SOLN
INTRAMUSCULAR | Status: AC
  Filled 2024-03-10: qty 2

## 2024-03-10 MED ORDER — MIDAZOLAM HCL 2 MG/2ML IJ SOLN
INTRAMUSCULAR | Status: AC
  Filled 2024-03-10: qty 2

## 2024-03-10 MED ORDER — FENTANYL CITRATE (PF) 100 MCG/2ML IJ SOLN
INTRAMUSCULAR | Status: DC | PRN
  Administered 2024-03-10 (×2): 50 ug via INTRAVENOUS

## 2024-03-10 MED ORDER — ACETAMINOPHEN 500 MG PO TABS
1000.0000 mg | ORAL_TABLET | Freq: Once | ORAL | Status: AC
  Administered 2024-03-10: 1000 mg via ORAL

## 2024-03-10 MED ORDER — ONDANSETRON HCL 4 MG/2ML IJ SOLN
INTRAMUSCULAR | Status: DC | PRN
  Administered 2024-03-10: 4 mg via INTRAVENOUS

## 2024-03-10 MED ORDER — LIDOCAINE-EPINEPHRINE (PF) 1 %-1:200000 IJ SOLN
INTRAMUSCULAR | Status: DC | PRN
  Administered 2024-03-10: 10 mL

## 2024-03-10 MED ORDER — MIDAZOLAM HCL (PF) 2 MG/2ML IJ SOLN
INTRAMUSCULAR | Status: DC | PRN
  Administered 2024-03-10 (×2): 1 mg via INTRAVENOUS

## 2024-03-10 MED ORDER — LIDOCAINE 2% (20 MG/ML) 5 ML SYRINGE
INTRAMUSCULAR | Status: AC
  Filled 2024-03-10: qty 5

## 2024-03-10 MED ORDER — LIDOCAINE 2% (20 MG/ML) 5 ML SYRINGE
INTRAMUSCULAR | Status: DC | PRN
  Administered 2024-03-10: 40 mg via INTRAVENOUS

## 2024-03-10 MED ORDER — CEFAZOLIN SODIUM-DEXTROSE 2-4 GM/100ML-% IV SOLN
INTRAVENOUS | Status: AC
  Filled 2024-03-10: qty 100

## 2024-03-10 MED ORDER — OXYCODONE HCL 5 MG PO TABS: 5.0000 mg | ORAL_TABLET | Freq: Four times a day (QID) | ORAL | 0 refills | Status: AC | PRN

## 2024-03-10 MED ORDER — DEXAMETHASONE SOD PHOSPHATE PF 10 MG/ML IJ SOLN
INTRAMUSCULAR | Status: DC | PRN
  Administered 2024-03-10: 5 mg via INTRAVENOUS

## 2024-03-10 MED ORDER — LIDOCAINE-EPINEPHRINE (PF) 1 %-1:200000 IJ SOLN
INTRAMUSCULAR | Status: AC
  Filled 2024-03-10: qty 30

## 2024-03-10 MED ORDER — FENTANYL CITRATE (PF) 100 MCG/2ML IJ SOLN: 25.0000 ug | INTRAMUSCULAR | Status: DC | PRN

## 2024-03-10 MED ORDER — ACETAMINOPHEN 500 MG PO TABS
ORAL_TABLET | ORAL | Status: AC
  Filled 2024-03-10: qty 2

## 2024-03-10 MED ORDER — CEFAZOLIN SODIUM-DEXTROSE 2-4 GM/100ML-% IV SOLN
2.0000 g | INTRAVENOUS | Status: AC
  Administered 2024-03-10: 2 g via INTRAVENOUS

## 2024-03-10 MED ORDER — PROPOFOL 500 MG/50ML IV EMUL
INTRAVENOUS | Status: AC
  Filled 2024-03-10: qty 50

## 2024-03-10 MED ORDER — LACTATED RINGERS IV SOLN: INTRAVENOUS | Status: DC

## 2024-03-10 MED ORDER — DROPERIDOL 2.5 MG/ML IJ SOLN: 0.6250 mg | Freq: Once | INTRAMUSCULAR | Status: DC | PRN

## 2024-03-10 MED ORDER — OXYCODONE HCL 5 MG PO TABS: 5.0000 mg | ORAL_TABLET | Freq: Once | ORAL | Status: DC | PRN

## 2024-03-10 SURGICAL SUPPLY — 36 items
APPLICATOR COTTON TIP 6 STRL (MISCELLANEOUS) ×1 IMPLANT
APPLICATOR DR MATTHEWS STRL (MISCELLANEOUS) ×1 IMPLANT
BLADE SURG 15 STRL LF DISP TIS (BLADE) ×2 IMPLANT
BNDG COHESIVE 4X5 TAN STRL LF (GAUZE/BANDAGES/DRESSINGS) ×1 IMPLANT
BNDG COMPR ESMARK 4X3 LF (GAUZE/BANDAGES/DRESSINGS) ×1 IMPLANT
CHLORAPREP W/TINT 26 (MISCELLANEOUS) ×1 IMPLANT
CORD BIPOLAR FORCEPS 12FT (ELECTRODE) ×1 IMPLANT
COVER BACK TABLE 60X90IN (DRAPES) ×1 IMPLANT
CUFF TOURN SGL QUICK 18X4 (TOURNIQUET CUFF) IMPLANT
DERMABOND ADVANCED .7 DNX12 (GAUZE/BANDAGES/DRESSINGS) IMPLANT
DRAPE HAND 77X146 (DRAPES) ×1 IMPLANT
DRAPE SURG 17X23 STRL (DRAPES) ×1 IMPLANT
DRSG AQUACEL AG 3.5X4 (GAUZE/BANDAGES/DRESSINGS) ×1 IMPLANT
DRSG TEGADERM 2-3/8X2-3/4 SM (GAUZE/BANDAGES/DRESSINGS) ×1 IMPLANT
DRSG TEGADERM 4X4.75 (GAUZE/BANDAGES/DRESSINGS) IMPLANT
GAUZE SPONGE 2X2 12-PLY NSTRL (GAUZE/BANDAGES/DRESSINGS) ×1 IMPLANT
GAUZE SPONGE 4X4 12PLY STRL (GAUZE/BANDAGES/DRESSINGS) IMPLANT
GAUZE XEROFORM 1X8 LF (GAUZE/BANDAGES/DRESSINGS) ×1 IMPLANT
GLOVE BIO SURGEON STRL SZ7.5 (GLOVE) ×1 IMPLANT
GLOVE BIOGEL PI IND STRL 7.5 (GLOVE) ×1 IMPLANT
GOWN STRL REUS W/ TWL LRG LVL3 (GOWN DISPOSABLE) ×2 IMPLANT
GOWN STRL SURGICAL XL XLNG (GOWN DISPOSABLE) ×2 IMPLANT
KIT ENDO FORWARD CUT SAFEVIEW (SYSTAGENIX WOUND MANAGEMENT) ×1 IMPLANT
KIT ENDO REVERSE CUT SAFEVIEW (CUTTER) IMPLANT
NEEDLE HYPO 25X5/8 SAFETYGLIDE (NEEDLE) IMPLANT
PACK BASIN DAY SURGERY FS (CUSTOM PROCEDURE TRAY) ×1 IMPLANT
SHEET MEDIUM DRAPE 40X70 STRL (DRAPES) ×1 IMPLANT
SOLN 0.9% NACL POUR BTL 1000ML (IV SOLUTION) IMPLANT
SOLUTION ANTFG W/FOAM PAD STRL (MISCELLANEOUS) ×1 IMPLANT
SPIKE FLUID TRANSFER (MISCELLANEOUS) IMPLANT
STOCKINETTE IMPERVIOUS 9X36 MD (GAUZE/BANDAGES/DRESSINGS) ×1 IMPLANT
SUT ETHILON 4 0 PS 2 18 (SUTURE) ×1 IMPLANT
SYR BULB EAR ULCER 3OZ GRN STR (SYRINGE) ×2 IMPLANT
SYR CONTROL 10ML LL (SYRINGE) ×1 IMPLANT
TOWEL GREEN STERILE FF (TOWEL DISPOSABLE) ×2 IMPLANT
UNDERPAD 30X36 HEAVY ABSORB (UNDERPADS AND DIAPERS) ×1 IMPLANT

## 2024-03-10 NOTE — H&P (Signed)
 @LOGODEPT @  Paige Young - 29 y.o. female MRN [id number]  Date of birth: 1996-02-06   HAND SURGERY H&P UPDATE   HPI: Patient is a 29 y.o. female who presents with right carpal tunnel syndrome.  Patient denies any changes to their medical history or new systemic symptoms today.    Past Medical History:  Diagnosis Date   Asthma    Seasonal allergies    Past Surgical History:  Procedure Laterality Date   CESAREAN SECTION N/A 06/30/2021   Procedure: CESAREAN SECTION;  Surgeon: Herchel Gloris LABOR, MD;  Location: [facility] LD ORS;  Service: Obstetrics;  Laterality: N/A;   Social History   Socioeconomic History   Marital status: Single    Spouse name: Not on file   Number of children: Not on file   Years of education: 12   Highest education level: High school graduate  Occupational History   Not on file  Tobacco Use   Smoking status: Some Days    Types: Cigarettes   Smokeless tobacco: Never  Vaping Use   Vaping status: Never Used  Substance and Sexual Activity   Alcohol use: Not Currently    Comment: socially   Drug use: Not Currently    Types: Marijuana    Comment: last used 2 weeks ago   Sexual activity: Yes    Birth control/protection: Condom  Other Topics Concern   Not on file  Social History Narrative   ** Merged History Encounter **       Social Drivers of Health   Tobacco Use: High Risk (03/10/2024)   Patient History    Smoking Tobacco Use: Some Days    Smokeless Tobacco Use: Never    Passive Exposure: Not on file  Financial Resource Strain: Not on File (12/09/2021)   Received from [employer]    Financial Resource Strain: 0  Food Insecurity: Not on File (12/09/2021)   Received from [facility] Insecurity    Food: 0  Transportation Needs: Not on File (12/09/2021)   Received from [employer] Needs    Transportation: 0  Recent Concern: Transportation Needs - At Risk (12/09/2021)   Received from [employer] Needs     Transportation: 2  Physical Activity: Not on File (12/09/2021)   Received from [facility]   Physical Activity    Physical Activity: 0  Recent Concern: Physical Activity - At Risk (12/09/2021)   Received from [facility]   Physical Activity    Physical Activity: 2  Stress: Not on File (06/26/2021)   Received from [facility]   Stress    Stress: 0  Social Connections: Not on File (11/16/2022)   Received from [facility]   Social Connections    Connectedness: 0  Depression (PHQ2-9): Low Risk (06/20/2021)   Depression (PHQ2-9)    PHQ-2 Score: 2  Recent Concern: Depression (PHQ2-9) - Medium Risk (04/15/2021)   Depression (PHQ2-9)    PHQ-2 Score: 8  Alcohol Screen: Not on file  Housing: Not on file  Utilities: Not on file  Health Literacy: Not on file   Family History  Problem Relation Age of Onset   Asthma Mother    Hypertension Father    - negative except otherwise stated in the family history section Allergies[1] Prior to Admission medications  Medication Sig Start Date End Date Taking? Authorizing Provider  acetaminophen  (TYLENOL ) 500 MG tablet Take 2 tablets (1,000 mg total) by mouth every 6 (six) hours. Patient not taking: Reported  on 07/10/2021 07/02/21   Virgilio Payor, MD  albuterol  (VENTOLIN  HFA) 108 (90 Base) MCG/ACT inhaler Inhale 2 puffs into the lungs every 6 (six) hours as needed for wheezing or shortness of breath. Patient not taking: Reported on 07/10/2021 02/05/21   Nugent, Nat BRAVO, NP  Blood Pressure Monitoring (BLOOD PRESSURE KIT) DEVI 1 kit by Does not apply route once a week. Check Blood Pressure regularly and record readings into the [employer] App.  Large Cuff.  DX O90.0 Patient not taking: Reported on 07/10/2021 06/12/21   Constant, Peggy, MD  ferrous sulfate  325 (65 FE) MG tablet Take 1 tablet (325 mg total) by mouth every other day. Patient not taking: Reported on 07/10/2021 07/03/21 09/01/21  Das, Anuka, MD  polyethylene glycol (MIRALAX  / GLYCOLAX ) 17 g packet Take 17 g by mouth  daily. Patient not taking: Reported on 07/10/2021 07/02/21   Das, Anuka, MD  Prenatal-Fe Fum-Methf-FA w/o A (VITAFOL -NANO) 18-0.6-0.4 MG TABS Take 1 tablet by mouth daily. 01/28/21   Eveline Lynwood MATSU, MD   No results found. - Positive ROS: All other systems have been reviewed and were otherwise negative with the exception of those mentioned in the HPI and as above.  Physical Exam: General: No acute distress, resting comfortably Cardiovascular: BUE warm and well perfused, normal rate Respiratory: Normal WOB on RA Skin: Warm and dry Neurologic: Sensation intact distally Psychiatric: Patient is at baseline mood and affect  Right Upper Extremity  Right upper extremity: - Prior well-healed laceration injuries to the ring and small finger, unable to discern exact laceration site given the appropriate healing - Unable to perform active flexion at the DIP of the ring and small finger when isolated, PIP of both ring and small finger with appropriate range of motion - Able to form composite fist, DIP of the ring and small finger remain extended - Sensation diminished to light touch in median nerve distribution - APB 5/5 without thenar atrophy, negative Froment's, AIN/PIN/interosseous intact   Assessment/Plan: OR today for right endoscopic carpal tunnel release. We again reviewed the risks of surgery which include bleeding, infection, damage to neurovascular structures, persistent symptoms, conversion to open surgery, need for additional surgery.  Informed consent was signed.  All questions were answered.   Veva Grimley [facility], Hand Surgery      [1]  Allergies Allergen Reactions   Shellfish Allergy Other (See Comments)

## 2024-03-10 NOTE — Op Note (Signed)
 NAME: Paige Young MEDICAL RECORD NO: [id number] DATE OF BIRTH: Nov 25, 1995 FACILITY: [facility] LOCATION: [facility] PHYSICIAN: GILDARDO ALDERTON, MD   OPERATIVE REPORT   DATE OF PROCEDURE: 03/10/2024    PREOPERATIVE DIAGNOSIS: Right carpal tunnel syndrome   POSTOPERATIVE DIAGNOSIS: Right carpal tunnel syndrome   PROCEDURE: Right endoscopic carpal tunnel release   SURGEON:  Gildardo Alderton, M.D.   ASSISTANT: Joesph Hooks, OPA   ANESTHESIA:  General   INTRAVENOUS FLUIDS:  Per anesthesia flow sheet.   ESTIMATED BLOOD LOSS:  Minimal.   COMPLICATIONS:  None.   SPECIMENS:  none   TOURNIQUET TIME:    Total Tourniquet Time Documented: Upper Arm (Right) - 6 minutes Total: Upper Arm (Right) - 6 minutes    DISPOSITION:  Stable to PACU.   INDICATIONS: 29 year old female with clinical and electrodiagnostic evidence of right-sided carpal tunnel syndrome refractory to conservative care.  Patient was indicated for right open versus endoscopic carpal tunnel release.  Patient elected to have surgery in the form of endoscopic carpal tunnel release.  Risks and benefits of surgery were discussed including the risks of infection, bleeding, scarring, stiffness, nerve injury, vascular injury, tendon injury, need for subsequent operation, persistent symptoms, possible conversion to open surgery.  She voiced understanding of these risks and elected to proceed.  OPERATIVE COURSE: Patient was seen and identified in the preoperative area and marked appropriately.  Surgical consent had been signed. Preoperative IV antibiotic prophylaxis was given. She was transferred to the operating room and placed in supine position with the Right upper extremity on an arm board.  General anesthesia was induced by the anesthesiologist.  Right upper extremity was prepped and draped in normal sterile orthopedic fashion.  A surgical pause was performed between the surgeons, anesthesia, and operating room  staff and all were in agreement as to the patient, procedure, and site of procedure.  Tourniquet was placed and padded appropriately to the right upper arm.  The arm was exsanguinated and the tourniquet was inflated to 250 mmHg.  A 1.5 cm skin incision was designed transversely proximal to the wrist flexion crease.  This incision was carried down through the subcutaneous tissues and through the forearm fascia.  A synovial elevator was introduced to identify the carpal tunnel space.  Sequential dilators were then used to open the carpal tunnel.  The cannula from the SafeView system was introduced in antegrade fashion into the carpal tunnel, and a standard wrist endoscope was used to visualize the undersurface of the transverse carpal ligament.  A probe and a rasp were used to delineate the distal edge of the transverse carpal ligament and to clear the underlying synovial tissues.  At this juncture, a forward cutting blade was introduced in an antegrade fashion was used to divide the transverse carpal ligament in its entirety.  Care was taken to ensure complete division of the structure by probing the resultant defect.  The endoscopic instruments were then removed.  At this point in the procedure, the median nerve was identified at the wrist flexion crease.  The distal end of the forearm fascia was released using tenotomy scissors with particular care taken to avoid injury to the palmar cutaneous branch of the median nerve.  The median nerve appeared completely decompressed in both the palm and distal forearm.  The wound was copiously irrigated, the tourniquet was deflated and hemostasis was achieved with bipolar electrocautery.  Tourniquet time was 6 minutes.  The wound was closed with 4-0 nylon suture in vertical mattress  fashion.  Sterile dressings were applied.  Patient was subsequently awoken from anesthesia and transported to the postoperative unit in stable condition.  Post-operative plan: The patient  will recover in the post-anesthesia care unit and then be discharged home.  The patient will be non weight bearing on the right upper extremity in a soft dressing.   I will see the patient back in the office in 2 weeks for postoperative followup.    Daritza Brees, MD Electronically signed, 03/10/2024

## 2024-03-10 NOTE — Anesthesia Postprocedure Evaluation (Signed)
"   Anesthesia Post Note  [name]  Procedure(s) Performed: RELEASE, CARPAL TUNNEL, ENDOSCOPIC (Right: Wrist)     Patient location during evaluation: PACU Anesthesia Type: General Level of consciousness: awake and alert Pain management: pain level controlled Vital Signs Assessment: post-procedure vital signs reviewed and stable Respiratory status: spontaneous breathing, nonlabored ventilation and respiratory function stable Cardiovascular status: blood pressure returned to baseline Postop Assessment: no apparent nausea or vomiting Anesthetic complications: no   No notable events documented.  Last Vitals:  Vitals:   03/10/24 0815 03/10/24 0830  BP: 92/66 102/61  Pulse: 76   Resp: 17   Temp:    SpO2: 98%     Last Pain:  Vitals:   03/10/24 0830  TempSrc:   PainSc: 1    Pain Goal: Patients Stated Pain Goal: 6 (03/10/24 0636)                 Vertell Row      "

## 2024-03-10 NOTE — Transfer of Care (Signed)
 Immediate Anesthesia Transfer of Care Note  [name]  Procedure(s) Performed: Procedures (LRB): RELEASE, CARPAL TUNNEL, ENDOSCOPIC (Right)  Patient Location: PACU  Anesthesia Type: General  Level of Consciousness: awake, oriented, sedated and patient cooperative  Airway & Oxygen Therapy: Patient Spontanous Breathing and Patient connected to face mask oxygen  Post-op Assessment: Report given to PACU RN and Post -op Vital signs reviewed and stable  Post vital signs: Reviewed and stable  Complications: No apparent anesthesia complications Last Vitals:  Vitals Value Taken Time  BP 92/66 03/10/24 08:15  Temp    Pulse 75 03/10/24 08:15  Resp 20 03/10/24 08:15  SpO2 97 % 03/10/24 08:15  Vitals shown include unfiled device data.  Last Pain:  Vitals:   03/10/24 0636  TempSrc: Tympanic  PainSc: 0-No pain      Patients Stated Pain Goal: 6 (03/10/24 0636)  Complications: No notable events documented.

## 2024-03-10 NOTE — Discharge Instructions (Addendum)
" ° ° °  Hand Surgery Postop Instructions    Dressings: Maintain postoperative dressing for 5 days.   After 5 days, it is okay to unwrap postoperative dressing and apply Band-Aid or rewrap.   Keep operative site clean and dry until orthopedic follow-up.  Wound Care: Keep your hand elevated above the level of your heart.  Do not allow it to dangle by your side. Moving your fingers is advised to stimulate circulation but will depend on the site of your surgery.  If you have a splint applied, your doctor will advise you regarding movement.  Activity: Do not drive or operate machinery until clearance given from physician. No heavy lifting with operative extremity.  Diet:  Drink liquids today or eat a light diet.  You may resume a regular diet tomorrow.    General expectations: Pain for two to three days. Take prescribed medication if given, transition to over-the-counter medication as quickly as possible. Fingers may become slightly swollen.  Call your doctor if any of the following occur: Severe pain not relieved by pain medication. Elevated temperature. Dressing soaked with blood. Inability to move fingers. White or bluish color to fingers.   Per [facility] clinic policy, our goal is ensure optimal postoperative pain control with a multimodal pain management strategy. For all [facility] patients, our goal is to wean post-operative narcotic medications by 6 weeks post-operatively. If this is not possible due to utilization of pain medication prior to surgery, your [facility] doctor will support your acute post-operative pain control for the first 6 weeks postoperatively, with a plan to transition you back to your primary pain team following that. Maralee will work to ensure a therapist, occupational.  Anshul Afton Alderton, M.D. Hand Surgery [facility]  No Tylenol  until 12:40pm today, if needed. "

## 2024-03-10 NOTE — Anesthesia Procedure Notes (Signed)
 Procedure Name: LMA Insertion Date/Time: 03/10/2024 7:30 AM  Performed by: Delayne Olam BIRCH, CRNAPre-anesthesia Checklist: Patient identified, Emergency Drugs available, Suction available and Patient being monitored Patient Re-evaluated:Patient Re-evaluated prior to induction Oxygen Delivery Method: Circle system utilized Preoxygenation: Pre-oxygenation with 100% oxygen Induction Type: IV induction Ventilation: Mask ventilation without difficulty LMA: LMA inserted LMA Size: 3.0 Number of attempts: 1 Airway Equipment and Method: Bite block Placement Confirmation: positive ETCO2 Tube secured with: Tape Dental Injury: Teeth and Oropharynx as per pre-operative assessment

## 2024-03-11 ENCOUNTER — Encounter (HOSPITAL_BASED_OUTPATIENT_CLINIC_OR_DEPARTMENT_OTHER): Payer: Self-pay | Admitting: Orthopedic Surgery

## 2024-03-15 ENCOUNTER — Encounter: Payer: Self-pay | Admitting: Occupational Therapy

## 2024-03-15 ENCOUNTER — Ambulatory Visit: Attending: Physician Assistant | Admitting: Occupational Therapy

## 2024-03-15 ENCOUNTER — Other Ambulatory Visit: Payer: Self-pay

## 2024-03-15 DIAGNOSIS — R29898 Other symptoms and signs involving the musculoskeletal system: Secondary | ICD-10-CM | POA: Insufficient documentation

## 2024-03-15 DIAGNOSIS — G5601 Carpal tunnel syndrome, right upper limb: Secondary | ICD-10-CM | POA: Diagnosis not present

## 2024-03-15 DIAGNOSIS — R208 Other disturbances of skin sensation: Secondary | ICD-10-CM | POA: Diagnosis present

## 2024-03-15 DIAGNOSIS — M6281 Muscle weakness (generalized): Secondary | ICD-10-CM | POA: Insufficient documentation

## 2024-03-15 DIAGNOSIS — R278 Other lack of coordination: Secondary | ICD-10-CM | POA: Insufficient documentation

## 2024-03-15 NOTE — Patient Instructions (Signed)
 SABRA

## 2024-03-15 NOTE — Therapy (Signed)
 " OUTPATIENT OCCUPATIONAL THERAPY ORTHO EVALUATION  Patient Name: Paige Young MRN: [id number] DOB:06-Oct-1995, 29 y.o., female Today's Date: 03/15/2024  PCP: Buck Search, PA-C  REFERRING PROVIDER: Arlinda Buster, MD  END OF SESSION:   OT End of Session - 03/15/24 1322     Visit Number 1    Number of Visits 8    Date for Recertification  05/12/24    Authorization Type [employer] 2026 $4 Copay VL: [id number] AUTH REQUIRED    OT Start Time 1320    OT Stop Time 1400    OT Time Calculation (min) 40 min    Equipment Utilized During Treatment testing material    Activity Tolerance Patient tolerated treatment well    Behavior During Therapy WFL for tasks assessed/performed           Past Medical History:  Diagnosis Date   Asthma    Seasonal allergies    Past Surgical History:  Procedure Laterality Date   CARPAL TUNNEL RELEASE Right 03/10/2024   Procedure: RELEASE, CARPAL TUNNEL, ENDOSCOPIC;  Surgeon: Arlinda Buster, MD;  Location: [facility];  Service: Orthopedics;  Laterality: Right;   CESAREAN SECTION N/A 06/30/2021   Procedure: CESAREAN SECTION;  Surgeon: Herchel Gloris LABOR, MD;  Location: [facility];  Service: Obstetrics;  Laterality: N/A;   Patient Active Problem List   Diagnosis Date Noted   Carpal tunnel syndrome, right upper limb 03/10/2024   Cesarean delivery delivered 06/30/2021   Fetal growth restriction antepartum 05/15/2021   History of asthma 02/05/2021   Supervision of high-risk pregnancy 02/04/2021   Major depression 06/01/2013    ONSET DATE: Referral 02/23/2024 Surgery 03/10/24  REFERRING DIAG: G56.01 (ICD-10-CM) - Carpal tunnel syndrome, right upper limb Needs OT 5 days s/p right endo ctr no splint 03/11/23  THERAPY DIAG:  Muscle weakness (generalized)  Other symptoms and signs involving the musculoskeletal system  Other lack of coordination  Rationale for Evaluation and Treatment: Rehabilitation  SUBJECTIVE:   SUBJECTIVE  STATEMENT:  Pt reported that she is not as mobile and can't use her R hand as well as she could before.  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   She experienced a laceration to her medial digits last year - 02/06/23 and has since had difficulty with bending her DIP joints of ring/pinkie finger but then developed carpal tunnel and had surgery on 03/11/23.  Pt accompanied by: self  PERTINENT HISTORY: PMHx: depression, asthma  Pt presented to orthopedic hand surgeon in May 2025 with right hand symptoms numbness and tingling.  She had sustained a laceration injury to the zone 1 flexors of the ring and small finger in Nov 2024 however she was seen subacutely and was managed nonop.  She has appropriate flexion at the PIPs of the ring and small finger without active flexion of the DIP regions.   PRECAUTIONS: None  RED FLAGS: None   WEIGHT BEARING RESTRICTIONS: Yes Stitches still in R hand  PAIN:  Are you having pain? No  FALLS: Has patient fallen in last 6 months? 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  HAND  DOMINANCE: Right  ADLs: Overall ADLs: relying more on L hand to help, can't change son's diaper  FUNCTIONAL OUTCOME MEASURES: Quick Dash: 52.3  UPPER EXTREMITY ROM:     Active ROM Right eval Left eval  Shoulder flexion WFL   Shoulder abduction    Shoulder adduction    Shoulder extension    Shoulder internal rotation    Shoulder external rotation    Elbow flexion    Elbow extension    Wrist flexion 30 70  Wrist extension 50 70  Wrist  ulnar deviation    Wrist radial deviation    Wrist pronation    Wrist supination    (Blank rows = not tested)  Active ROM Right eval Left eval  Thumb MCP (0-60)    Thumb IP (0-80)    Thumb Radial abd/add (0-55)     Thumb Palmar abd/add (0-45)     Thumb Opposition to Small Finger     Index MCP (0-90)     Index PIP (0-100)     Index DIP (0-70)      Long MCP (0-90)      Long PIP (0-100)      Long DIP (0-70)      Ring MCP (0-90)      Ring PIP (0-100)  90    Ring DIP (0-70)  0    Little MCP (0-90)      Little PIP (0-100)  90    Little DIP (0-70)  0    (Blank rows = not tested)   UPPER EXTREMITY MMT:     MMT Right eval Left eval  Shoulder flexion    Shoulder abduction    Shoulder adduction    Shoulder extension    Shoulder internal rotation    Shoulder external rotation    Middle trapezius    Lower trapezius    Elbow flexion    Elbow extension    Wrist flexion    Wrist extension    Wrist ulnar deviation    Wrist radial deviation    Wrist pronation    Wrist supination    (Blank rows = not tested)  HAND FUNCTION: Grip strength: Right: 6.8 lbs; Left: 40.1 lbs  COORDINATION: 9 Hole Peg test: Right: 22.25 sec; Left: 24.13 sec Right: Pinkie and ring finger extended during grasp  SENSATION: WFL  EDEMA: Slight R UE hand edema  COGNITION: Overall cognitive status: Within functional limits for tasks assessed  OBSERVATIONS: Pt ambulates with no AD and no loss of balance. The pt is petite and well kept.  She has observable limitations in DIP flexion of R ring and pinkie fingers.  Wound dressing had been falling off and was removed and replaced with a bandaid.  TREATMENT DATE: 03/16/23                                                                                                                            - Therapeutic exercises completed for duration  as noted below including: OT educated pt on rehabilitation process and results of objective measures in relation to  pt specific goals.   Pt issued tendon gliding exercises/handout with review of motions to isolate DIP, PIP and MCP joints for straight finger position, hook (DIP/PIP flexion), fist (DIP/PIP/MCP flexion), taco/duck (MCP flexion only) and flat fist (MCP and PIP flexion). Education provided on purpose of tendon glide exercises ie) to increase the circulation to the hand and wrist, reduce swelling and promote healthier soft tissue for increased AROM and not to build hand or wrist strength at this time.  Pt encouraged to add gentle PROM to DIP joints of R ring and pinkie finger with tendon glide HEP.  PATIENT EDUCATION: Education details: OT role and POC Considerations; Tendon Glide HEP Person educated: Patient Education method: Explanation, Demonstration, Tactile cues, Verbal cues, and Handouts Education comprehension: verbalized understanding, returned demonstration, verbal cues required, tactile cues required, and needs further education  HOME EXERCISE PROGRAM: 03/15/24: Tendon glide HEP  GOALS: Goals reviewed with patient? Yes  SHORT TERM GOALS: Target date: 04/08/24  Patient will demonstrate initial R UE HEP with 25% verbal cues or less for proper execution.  Baseline: New to outpt OT  Goal status: INITIAL  2.  Pt will be independent with scar massage to prevent excess scarring and limitations s/p CT sx. Baseline: New to outpt OT  Goal status: INITIAL  3.  Pt will independently recall at least 3 options for adaptive equipment to assist with joint protection, ergonomics, and body mechanic principles as noted in pt instructions for ADLs and IADLS.  Baseline:  New to outpt OT Goal status: INITIAL  LONG TERM GOALS: Target date: 05/12/24  Patient will demonstrate updated R UE HEP with visual handouts only for proper execution.  Baseline: New to outpt OT  Goal status: INITIAL  2.  Patient will demonstrate at least 35+ lbs RUE grip strength as needed to open jars and other  containers. Baseline: Grip strength: Right: 6.8 lbs; Left: 40.1 lbs Goal status: INITIAL  3.  Patient will demonstrate at least 16% improvement with quick Dash score (reporting <35% disability or less) indicating improved functional use of affected extremity. Baseline: Quick Dash: 52.3 Goal status: INITIAL  ASSESSMENT:  CLINICAL IMPRESSION: Patient is a 29 y.o. female who was seen today for occupational therapy evaluation for RUE dysfunction s/p carpal tunnel release surgery. Hx includes depression and probable RUE zone 1 flexors of the ring and small finger. Patient currently presents below baseline level of function in strength and coordination of RUE demonstrating functional deficits and impairments as noted below. Pt would benefit from skilled OT services in the outpatient setting to work on impairments as noted below to help pt return to PLOF as able.     PERFORMANCE DEFICITS: in functional skills including ADLs, IADLs, coordination, dexterity, sensation, edema, ROM, strength, pain, flexibility, Fine motor control, Gross motor control, decreased knowledge of precautions, decreased knowledge of use of DME, wound, and UE functional use, cognitive skills including emotional, and psychosocial skills including coping strategies.   IMPAIRMENTS: are limiting patient from ADLs, IADLs, work, leisure, and social participation.   COMORBIDITIES: has no other co-morbidities that affects occupational performance. Patient will benefit from skilled OT to address above impairments and improve overall function.  MODIFICATION OR ASSISTANCE TO COMPLETE EVALUATION: No modification of tasks or assist necessary to complete an evaluation.  OT OCCUPATIONAL PROFILE AND HISTORY: Problem focused assessment: Including review of records relating to presenting problem.  CLINICAL DECISION MAKING:  LOW - limited treatment options, no task modification necessary  REHAB POTENTIAL: Good  EVALUATION COMPLEXITY:  Low      PLAN:  OT FREQUENCY: 1-2x/week  OT DURATION: 8 weeks  PLANNED INTERVENTIONS: 97168 OT Re-evaluation, 97535 self care/ADL training, 02889 therapeutic exercise, 97530 therapeutic activity, 97140 manual therapy, 97039 fluidotherapy, 97032 electrical stimulation (manual), 97760 Orthotic Initial, [id number] Orthotic/Prosthetic subsequent, passive range of motion, coping strategies training, patient/family education, and DME and/or AE instructions  RECOMMENDED OTHER SERVICES: NA  CONSULTED AND AGREED WITH PLAN OF CARE: Patient  PLAN FOR NEXT SESSION:  Strengthening HEP AE PSFS Scar massage   Clarita LITTIE Pride, OT 03/15/2024, 1:21 PM  For all possible CPT codes, reference the Planned Interventions line above.     Check all conditions that are expected to impact treatment: {Conditions expected to impact treatment:None of these apply   If treatment provided at initial evaluation, no treatment charged due to lack of authorization.     "

## 2024-03-23 ENCOUNTER — Ambulatory Visit: Admitting: Orthopedic Surgery

## 2024-03-23 ENCOUNTER — Ambulatory Visit: Admitting: Occupational Therapy

## 2024-03-23 DIAGNOSIS — R278 Other lack of coordination: Secondary | ICD-10-CM

## 2024-03-23 DIAGNOSIS — M6281 Muscle weakness (generalized): Secondary | ICD-10-CM

## 2024-03-23 DIAGNOSIS — Z9889 Other specified postprocedural states: Secondary | ICD-10-CM

## 2024-03-23 DIAGNOSIS — R208 Other disturbances of skin sensation: Secondary | ICD-10-CM

## 2024-03-23 DIAGNOSIS — G5601 Carpal tunnel syndrome, right upper limb: Secondary | ICD-10-CM

## 2024-03-23 DIAGNOSIS — R29898 Other symptoms and signs involving the musculoskeletal system: Secondary | ICD-10-CM

## 2024-03-23 NOTE — Progress Notes (Signed)
" ° °  Paige Young - 29 y.o. female MRN [id number]  Date of birth: May 02, 1995  Office Visit Note: Visit Date: 03/23/2024 PCP: Buck Search, PA-C Referred by: Buck Search, PA-C  Subjective:  HPI: Paige Young is a 29 y.o. female who presents today for follow up 2 weeks status post right wrist endoscopic carpal tunnel release. Overall doing well, pain is well controlled.   Pertinent ROS were reviewed with the patient and found to be negative unless otherwise specified above in HPI.   Assessment & Plan: Visit Diagnoses:  1. Carpal tunnel syndrome, right upper limb   2. S/P endoscopic carpal tunnel release     Plan: Sutures removed today. She has been doing really well with no limiting factors. States she does have some soreness and is worried about heavy lifting but was counseled on starting off slow with the lifting until comfortable and no pain. Plan to see her back in 4 weeks for clinical recheck.  Follow-up: No follow-ups on file.   Meds & Orders: No orders of the defined types were placed in this encounter.  No orders of the defined types were placed in this encounter.    Procedures: No procedures performed       Objective:   Vital Signs: LMP 02/02/2024 (Approximate)   Ortho Exam right hand: - Well-healing wrist incision, sutures removed - Composite fist without restriction - 5/5 APB minimal thenar atrophy  Imaging: No results found.   Search D[place], OPA-C  "

## 2024-03-23 NOTE — Therapy (Signed)
 " OUTPATIENT OCCUPATIONAL THERAPY ORTHO TREATMENT  Patient Name: Paige Young MRN: [id number] DOB:[birth year], 29 y.o., female Today's Date: 03/23/2024  PCP: Paige Search, PA-C  REFERRING PROVIDER: Arlinda Buster, MD  END OF SESSION:   OT End of Session - 03/23/24 1142     Visit Number 2    Number of Visits 8    Date for Recertification  05/12/24    Authorization Type [employer] 2026 $4 Copay VL: 27 AUTH REQUIRED    OT Start Time 1145    OT Stop Time 1230    OT Time Calculation (min) 45 min    Equipment Utilized During Treatment Fluidotherapy, putty    Activity Tolerance Patient tolerated treatment well    Behavior During Therapy WFL for tasks assessed/performed          Past Medical History:  Diagnosis Date   Asthma    Seasonal allergies    Past Surgical History:  Procedure Laterality Date   CARPAL TUNNEL RELEASE Right 03/10/2024   Procedure: RELEASE, CARPAL TUNNEL, ENDOSCOPIC;  Surgeon: Paige Buster, MD;  Location: [facility];  Service: Orthopedics;  Laterality: Right;   CESAREAN SECTION N/A 06/30/2021   Procedure: CESAREAN SECTION;  Surgeon: Paige Young LABOR, MD;  Location: [facility];  Service: Obstetrics;  Laterality: N/A;   Patient Active Problem List   Diagnosis Date Noted   Carpal tunnel syndrome, right upper limb 03/10/2024   Cesarean delivery delivered 06/30/2021   Fetal growth restriction antepartum 05/15/2021   History of asthma 02/05/2021   Supervision of high-risk pregnancy 02/04/2021   Major depression 06/01/2013    ONSET DATE: Referral 02/23/2024 Surgery 03/10/24  REFERRING DIAG: G56.01 (ICD-10-CM) - Carpal tunnel syndrome, right upper limb Needs OT 5 days s/p right endo ctr no splint 03/11/23  THERAPY DIAG:  Muscle weakness (generalized)  Other lack of coordination  Other symptoms and signs involving the musculoskeletal system  Rationale for Evaluation and Treatment: Rehabilitation  SUBJECTIVE:   SUBJECTIVE  STATEMENT:   Pt saw the MD this morning and the stitches were removed.  She reports being more sensitive than experiencing pain.  Pt accompanied by: self  PERTINENT HISTORY: PMHx: depression, asthma  Pt presented to orthopedic hand surgeon in May 2025 with right hand symptoms numbness and tingling.  She had sustained a laceration injury to the zone 1 flexors of the ring and small finger in Nov 2024 however she was seen subacutely and was managed nonop.  She has appropriate flexion at the PIPs of the ring and small finger without active flexion of the DIP regions.   PRECAUTIONS: None  RED FLAGS: None   WEIGHT BEARING RESTRICTIONS: Yes Stitches still in R hand  PAIN:  Are you having pain? No  FALLS: Has patient fallen in last 6 months? 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  HAND DOMINANCE: Right  ADLs: Overall ADLs: relying more on L hand to help, can't change son's diaper  FUNCTIONAL OUTCOME MEASURES: Quick Dash: 52.3  UPPER EXTREMITY ROM:     Active ROM Right eval Left eval  Shoulder flexion [facility]  Shoulder abduction    Shoulder adduction    Shoulder extension    Shoulder internal rotation    Shoulder external rotation    Elbow flexion    Elbow extension    Wrist flexion 30 70  Wrist extension 50 70  Wrist ulnar deviation    Wrist radial deviation    Wrist pronation    Wrist supination    (Blank rows = not tested)  Active ROM Right eval Left eval  Thumb MCP (0-60)    Thumb IP (0-80)    Thumb Radial abd/add (0-55)      Thumb Palmar abd/add (0-45)     Thumb Opposition to Small Finger     Index MCP (0-90)     Index PIP (0-100)     Index DIP (0-70)      Long MCP (0-90)      Long PIP (0-100)      Long DIP (0-70)      Ring MCP (0-90)      Ring PIP (0-100)  90    Ring DIP (0-70)  0    Little MCP (0-90)      Little PIP (0-100)  90    Little DIP (0-70)  0    (Blank rows = not tested)   UPPER EXTREMITY MMT:     MMT Right eval Left eval  Shoulder flexion    Shoulder abduction    Shoulder adduction    Shoulder extension    Shoulder internal rotation    Shoulder external rotation    Middle trapezius    Lower trapezius    Elbow flexion    Elbow extension    Wrist flexion    Wrist extension    Wrist ulnar deviation    Wrist radial deviation    Wrist pronation    Wrist supination    (Blank rows = not tested)  HAND FUNCTION: Grip strength: Right: 6.8 lbs; Left: 40.1 lbs  COORDINATION: 9 Hole Peg test: Right: 22.25 sec; Left: 24.13 sec Right: Pinkie and ring finger extended during grasp  SENSATION: WFL  EDEMA: Slight R UE hand edema  COGNITION: Overall cognitive status: Within functional limits for tasks assessed  OBSERVATIONS: Pt ambulates with no AD and no loss of balance. The pt is petite and well kept.  She has observable limitations in DIP flexion of R ring and pinkie fingers.  Wound dressing had been falling off and was removed and replaced with a bandaid.  TREATMENT DATE: 03/24/23                                                                                                                            - Therapeutic exercises completed for duration as noted below including:  Pt placed RUE in Fluidotherapy machine with supervised ROM x 10 min. Pt was educated to complete tendon glides during modality time to improve ROM and decrease pain/stiffness of affected extremity by use of the machine's massaging action and  thermal properties.   Education provided on benefits of heat and in  particular fluidotherapy as well as initiated conversations re: joint protection while she was completing modality.    Reviewed tendon gliding exercises/handout with review of motions to isolate DIP, PIP and MCP joints for straight finger position, hook (DIP/PIP flexion), fist (DIP/PIP/MCP flexion), taco/duck (MCP flexion only) and flat fist (MCP and PIP flexion) and encouraged ongoing gentle PROM to DIP joints of R ring and pinkie finger with tendon glide HEP.  - Self Care education and training completed for duration as noted below including:  Therapist provided education re: scar massage at healed site of incision for reduction of scar tissue, to promote improved AROM and pain reduction of affected surgical site. Pt provided instruction re: massaging scar in three directions: circles, side to side, and up and down with return demonstration sought and handout provided per pt instruction.  OT initiated education re: joint protection principles  - Rest and Work Balance ie) balancing activities with appropriate rests during activity - Reduction of Effort - Use two hands instead of one if possible - Use of Larger/Stronger Joints Ie) Lift or carry with the forearm or shoulder rather than fingers - Avoid Activities That Cannot Be Stopped - Use of Assistive Equipment - Consider splint use and AE equipment to protect joints from deformity and stresses  - Therapeutic activities completed for duration as noted below including:  Initiated Putty Exercises with yellow putty to begin strengthening, coordination and sensory stimulation of B UEs.  Patient provided visual demonstration, verbal and tactile cues as needed to improve performance of the various exercises/activities including:   - Putty Squeezes - cues to squeeze putty into log for use with other exercises and to fold putty in half with 1 hand or roll into a cinnamon roll  - Putty Rolls - encourage to roll putty into logs with sensory stimulation  to entire length of hand, fingers and wrist as needed   - Pinch and Pull with Putty - this motion is combined with different pinches (3-Point Pinch, Tip Pinch, Key Pinch) - patient encouraged to combine tripod, pincer and/or key pinch with pinch and pull motion of putty pulling away from midline, changing between different pinches and changing different directions to change grip  - Finger Extension with Putty - pt shown how to work on task with all fingers and thumb as well as individual fingers in opposition to thumb  - Finger Adduction with Putty - pt shown how to work on weaving putty between fingers/thumb and then squeeze fingers together while laying hand flat on table top.  - Removing Objects from Putty  - encouraged to hide items (coins, marble, dice etc) and use one hand at a time to find the objects and identify them by tactile input before s/he digs them out and can see them visually.  OT educated patient on theraputty recommendations: avoid small children, pets, hot environments, place in designated container and avoid contact with fabrics. Patient verbalized understanding.    Patient benefited from extra time, verbal/tactile cues, and modeling of task to allow time for processing of verbal instructions and improve motor planning of unfamiliar movements.  PATIENT EDUCATION: Education details: Scar massage; Putty HEP Person educated: Patient Education method: Explanation, Demonstration, Tactile cues, Verbal cues, and Handouts Education comprehension: verbalized understanding, returned demonstration, verbal cues required, tactile cues required, and needs further education  HOME EXERCISE PROGRAM: 03/15/24: Tendon glide HEP 03/23/24: Scar Massage & Putty Activities Access Code: [email or web address]  GOALS: Goals reviewed with patient? Yes  SHORT TERM GOALS: Target date: 04/08/24  Patient will demonstrate initial R UE HEP with 25% verbal cues or less for proper execution.  Baseline: New to  outpt OT  Goal status: IN Progress   2.  Pt will be independent with scar massage to prevent excess scarring and limitations s/p CT sx. Baseline: New to outpt OT  Goal status: IN Progress   3.  Pt will independently recall at least 3 options for adaptive equipment to assist with joint protection, ergonomics, and body mechanic principles as noted in pt instructions for ADLs and IADLS.  Baseline:  New to outpt OT Goal status: IN Progress   LONG TERM GOALS: Target date: 05/12/24  Patient will demonstrate updated R UE HEP with visual handouts only for proper execution.  Baseline: New to outpt OT  Goal status: INITIAL  2.  Patient will demonstrate at least 35+ lbs RUE grip strength as needed to open jars and other containers. Baseline: Grip strength: Right: 6.8 lbs; Left: 40.1 lbs Goal status: IN Progress  3.  Patient will demonstrate at least 16% improvement with quick Dash score (reporting <35% disability or less) indicating improved functional use of affected extremity. Baseline: Quick Dash: 52.3 Goal status: INITIAL  ASSESSMENT:  CLINICAL IMPRESSION: Patient is a 29 y.o. female who was seen today for occupational therapy treatment for RUE dysfunction s/p carpal tunnel release surgery. Pt 2 weeks post-op s/p endoscopic surgery therefore introduced light strengthening with yellow putty today with good response form pt.  Pt will benefit from continued skilled OT services in the outpatient setting to work on impairments as noted at evaluation to help pt return to [facility] as able.    PERFORMANCE DEFICITS: in functional skills including ADLs, IADLs, coordination, dexterity, sensation, edema, ROM, strength, pain, flexibility, Fine motor control, Gross motor control, decreased knowledge of precautions, decreased knowledge of use of DME, wound, and UE functional use, cognitive skills including emotional, and psychosocial skills including coping strategies.   IMPAIRMENTS: are limiting patient from  ADLs, IADLs, work, leisure, and social participation.   COMORBIDITIES: has no other co-morbidities that affects occupational performance. Patient will benefit from skilled OT to address above impairments and improve overall function.  REHAB POTENTIAL: Good  PLAN:  OT FREQUENCY: 1-2x/week  OT DURATION: 8 weeks  PLANNED INTERVENTIONS: 97168 OT Re-evaluation, 97535 self care/ADL training, 02889 therapeutic exercise, 97530 therapeutic activity, 97140 manual therapy, 97039 fluidotherapy, 97032 electrical stimulation (manual), [id number] Orthotic Initial, [id number] Orthotic/Prosthetic subsequent, passive range of motion, coping strategies training, patient/family education, and DME and/or AE instructions  RECOMMENDED OTHER SERVICES: NA  CONSULTED AND AGREED WITH PLAN OF CARE: Patient  PLAN FOR NEXT SESSION:  Strengthening HEP AE Scar massage   Clarita LITTIE Pride, OT 03/23/2024, 11:43 AM  "

## 2024-03-23 NOTE — Patient Instructions (Addendum)
" ° ° °  Access Code: [email or web address] URL: [email or web address] Date: 03/23/2024 Prepared by: Clarita Pride  Exercises - Putty Squeezes  - 1-2 x daily - 10 reps - Rolling Putty on Table  - 1-2 x daily - 10 reps - Finger Pinch and Pull with Putty  - 1-2 x daily - 10 reps - 3-Point Pinch with Putty  - 1-2 x daily - 10 reps - Tip PUSH with Putty  - 1-2 x daily - 10 reps - Key Pinch with Putty  - 1-2 x daily - 10 reps - Finger Extension with Putty  - 1-2 x daily - 10 reps - Finger Adduction with Putty  - 1-2 x daily - 10 reps - Removing Marbles from Putty  - 1-2 x daily - 10 reps    "

## 2024-03-28 ENCOUNTER — Ambulatory Visit: Admitting: Occupational Therapy

## 2024-03-28 DIAGNOSIS — M6281 Muscle weakness (generalized): Secondary | ICD-10-CM

## 2024-03-28 DIAGNOSIS — R278 Other lack of coordination: Secondary | ICD-10-CM

## 2024-03-28 DIAGNOSIS — R208 Other disturbances of skin sensation: Secondary | ICD-10-CM

## 2024-03-28 DIAGNOSIS — R29898 Other symptoms and signs involving the musculoskeletal system: Secondary | ICD-10-CM

## 2024-03-28 NOTE — Therapy (Signed)
 " OUTPATIENT OCCUPATIONAL THERAPY ORTHO TREATMENT  Patient Name: Paige Young MRN: [id number] DOB:March 11, 1995, 29 y.o., female Today's Date: 03/28/2024  PCP: Buck Search, PA-C  REFERRING PROVIDER: Arlinda Buster, MD  END OF SESSION:   OT End of Session - 03/28/24 1231     Visit Number 3    Number of Visits 8    Date for Recertification  05/12/24    Authorization Type [facility] Medicaid [id number]    Authorization Time Period 03/23/24 - 05/22/24    Authorization - Visit Number 2    Authorization - Number of Visits 6    OT Start Time 1233    OT Stop Time 1315    OT Time Calculation (min) 42 min    Equipment Utilized During Treatment FM objects    Activity Tolerance Patient tolerated treatment well    Behavior During Therapy WFL for tasks assessed/performed          Past Medical History:  Diagnosis Date   Asthma    Seasonal allergies    Past Surgical History:  Procedure Laterality Date   CARPAL TUNNEL RELEASE Right 03/10/2024   Procedure: RELEASE, CARPAL TUNNEL, ENDOSCOPIC;  Surgeon: Arlinda Buster, MD;  Location: [facility];  Service: Orthopedics;  Laterality: Right;   CESAREAN SECTION N/A 06/30/2021   Procedure: CESAREAN SECTION;  Surgeon: Herchel Gloris LABOR, MD;  Location: [facility];  Service: Obstetrics;  Laterality: N/A;   Patient Active Problem List   Diagnosis Date Noted   Carpal tunnel syndrome, right upper limb 03/10/2024   Cesarean delivery delivered 06/30/2021   Fetal growth restriction antepartum 05/15/2021   History of asthma 02/05/2021   Supervision of high-risk pregnancy 02/04/2021   Major depression 06/01/2013    ONSET DATE: Referral 02/23/2024 Surgery 03/10/24  REFERRING DIAG: G56.01 (ICD-10-CM) - Carpal tunnel syndrome, right upper limb Needs OT 5 days s/p right endo ctr no splint 03/11/23  THERAPY DIAG:  Other lack of coordination  Muscle weakness (generalized)  Other symptoms and signs involving the musculoskeletal system  Other  disturbances of skin sensation  Rationale for Evaluation and Treatment: Rehabilitation  SUBJECTIVE:   SUBJECTIVE STATEMENT:   Pt continues to reports she is still tender across palm of hand (mostly on the ulnar side) which has been ongoing since the surgery.  Pt accompanied by: self  PERTINENT HISTORY: PMHx: depression, asthma  Pt presented to orthopedic hand surgeon in May 2025 with right hand symptoms numbness and tingling.  She had sustained a laceration injury to the zone 1 flexors of the ring and small finger in Nov 2024 however she was seen subacutely and was managed nonop.  She has appropriate flexion at the PIPs of the ring and small finger without active flexion of the DIP regions.   PRECAUTIONS: None  RED FLAGS: None   WEIGHT BEARING RESTRICTIONS: No  PAIN:  Are you having pain? No - just tender and sensitive  FALLS: Has patient fallen in last 6 months? 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  HAND DOMINANCE: Right  ADLs: Overall ADLs: relying more on L hand to help, can't change son's diaper  FUNCTIONAL OUTCOME MEASURES: Quick Dash: 52.3  UPPER EXTREMITY ROM:     Active ROM Right eval Left eval  Shoulder flexion [facility]   Shoulder abduction    Shoulder adduction    Shoulder extension    Shoulder internal rotation    Shoulder external rotation    Elbow flexion    Elbow extension    Wrist flexion 30 70  Wrist extension 50 70  Wrist ulnar deviation    Wrist radial deviation    Wrist pronation    Wrist supination    (Blank rows = not  tested)  Active ROM Right eval Left eval  Thumb MCP (0-60)    Thumb IP (0-80)    Thumb Radial abd/add (0-55)     Thumb Palmar abd/add (0-45)     Thumb Opposition to Small Finger     Index MCP (0-90)     Index PIP (0-100)     Index DIP (0-70)      Long MCP (0-90)      Long PIP (0-100)      Long DIP (0-70)      Ring MCP (0-90)      Ring PIP (0-100)  90    Ring DIP (0-70)  0    Little MCP (0-90)      Little PIP (0-100)  90    Little DIP (0-70)  0    (Blank rows = not tested)   UPPER EXTREMITY MMT:     MMT Right eval Left eval  Shoulder flexion    Shoulder abduction    Shoulder adduction    Shoulder extension    Shoulder internal rotation    Shoulder external rotation    Middle trapezius    Lower trapezius    Elbow flexion    Elbow extension    Wrist flexion    Wrist extension    Wrist ulnar deviation    Wrist radial deviation    Wrist pronation    Wrist supination    (Blank rows = not tested)  HAND FUNCTION: Grip strength: Right: 6.8 lbs; Left: 40.1 lbs  03/28/24 Right 31.9, 38.8, 39.4 Left 54.6, 54.8, 60.8 Average Right: 36.7 lbs; Left: 56.7 lbs  COORDINATION: 9 Hole Peg test: Right: 22.25 sec; Left: 24.13 sec Right: Pinkie and ring finger extended during grasp  SENSATION: WFL  EDEMA: Slight R UE hand edema  COGNITION: Overall cognitive status: Within functional limits for tasks assessed  OBSERVATIONS: Pt ambulates with no AD and no loss of balance. The pt is petite and well kept.  She has observable limitations in DIP flexion of R ring and pinkie fingers.  Wound dressing had been falling off and was removed and replaced with a bandaid.  TREATMENT DATE: 03/29/23                                                                                                                            - Therapeutic activities completed for duration as  noted below including:  Retested pt's grip strength today Right: 36.7 lbs; Left: 56.7 lbs with good improvement noted  in both hands since eval: Right: 6.8 lbs; Left: 40.1 lbs  Facilitated pinch strengthening with use of therapy resistant clothespins to target lateral and 3 point pinch of R/L hand.  Able to pinch yellow, red (light resistance), green, blue (moderate resistance), and black pins (heavy resistance) with good tolerance.  Coordination Exercise/Activity handout with images provided for various activities to work on R UE finger ROM, dexterity and isolated movements with demonstration and practice, as well as modification, hand over hand guidance and cues throughout to improve technique, digital isolation and ease of performing task.  Tasks included:  Pick up coins, dice and objects of different sizes ... To place in containers To stack - with guidance to work on include/isolate specific fingers. To pick up items one at a time until patient got 5+ in their hand and then move item from palm to fingertips to release ie) Finger-to-palm then palm-to-finger translation of small items - Options to vary difficulty include using a washcloth under items like coins or using larger items (checkers vs coins or blocks/dominos vs dice) for increased ease of picking up items.  Shuffling, Flipping and dealing cards 1 at a time. -- Setup patient to work on sorting cards, focusing on using index finger with thumb to flip cards or holding deck of cards in palm of hand and push off 1 card at a time from the top of the deck using only thumb    Rotate golf balls (clockwise and counter-clockwise) with forearm pronated and balls on table or supinated and balls in hand.   Twirl pen/cil between fingers. - Encouragement to isolate fingers individually and twirl (rotation) or flipping and shift up and down the pen (translation) to get it in position for writing or erasing.    Tear a piece of paper towel and roll it into small balls with fingertips ie) straw wrapping when eating out.    Patient is encouraged to take breaks, relax  arm/shoulder by supporting forearm, minimize compensatory motions and a try different activities throughout the day/week including games like Londa (for the dice), card games, Connect 4 etc.   Patient benefited from extra time, verbal/tactile cues, and modeling of task to allow time for processing of verbal instructions and improve motor planning of unfamiliar movements.   PATIENT EDUCATION: Education details: Psychiatrist Person educated: Patient Education method: Explanation, Demonstration, Tactile cues, Verbal cues, and Handouts Education comprehension: verbalized understanding, returned demonstration, verbal cues required, tactile cues required, and needs further education  HOME EXERCISE PROGRAM: 03/15/24: Tendon glide HEP 03/23/24: Scar Massage & Putty Activities Access Code: [email or web address]  03/28/24: Coordination Activities  GOALS: Goals reviewed with patient? Yes  SHORT TERM GOALS: Target date: 04/08/24  Patient will demonstrate initial R UE HEP with 25% verbal cues or less for proper execution.  Baseline: New to outpt OT  Goal status: IN Progress   2.  Pt will be independent with scar massage to prevent excess scarring and limitations s/p CT sx. Baseline: New to outpt OT  Goal status: IN Progress   3.  Pt will independently recall at least 3 options for adaptive equipment to assist with joint protection, ergonomics, and body mechanic principles as noted in pt instructions for ADLs and IADLS.  Baseline:  New to outpt OT Goal status: IN Progress   LONG TERM GOALS: Target date: 05/12/24  Patient will demonstrate updated R UE HEP  with visual handouts only for proper execution.  Baseline: New to outpt OT  Goal status: IN Progress  2.  Patient will demonstrate at least 35+ lbs RUE grip strength as needed to open jars and other containers. Baseline: Grip strength: Right: 6.8 lbs; Left: 40.1 lbs Goal status: IN Progress  3.  Patient will demonstrate at least 16% improvement  with quick Dash score (reporting <35% disability or less) indicating improved functional use of affected extremity. Baseline: Quick Dash: 52.3 Goal status: IN Progress  ASSESSMENT:  CLINICAL IMPRESSION: Patient is a 29 y.o. female who was seen today for occupational therapy treatment for RUE dysfunction s/p carpal tunnel release surgery. Pt 3 weeks post-op s/p endoscopic surgery and has been preforming putty HEP therefore introduced coordination activities today with good response from pt.  Pt will benefit from continued skilled OT services in the outpatient setting to work on impairments as noted at evaluation to help pt return to [facility] as able.    PERFORMANCE DEFICITS: in functional skills including ADLs, IADLs, coordination, dexterity, sensation, edema, ROM, strength, pain, flexibility, Fine motor control, Gross motor control, decreased knowledge of precautions, decreased knowledge of use of DME, wound, and UE functional use, cognitive skills including emotional, and psychosocial skills including coping strategies.   IMPAIRMENTS: are limiting patient from ADLs, IADLs, work, leisure, and social participation.   COMORBIDITIES: has no other co-morbidities that affects occupational performance. Patient will benefit from skilled OT to address above impairments and improve overall function.  REHAB POTENTIAL: Good  PLAN:  OT FREQUENCY: 1-2x/week  OT DURATION: 8 weeks  PLANNED INTERVENTIONS: 97168 OT Re-evaluation, 97535 self care/ADL training, 02889 therapeutic exercise, 97530 therapeutic activity, 97140 manual therapy, 97039 fluidotherapy, 97032 electrical stimulation (manual), [id number] Orthotic Initial, [id number] Orthotic/Prosthetic subsequent, passive range of motion, coping strategies training, patient/family education, and DME and/or AE instructions  RECOMMENDED OTHER SERVICES: NA  CONSULTED AND AGREED WITH PLAN OF CARE: Patient  PLAN FOR NEXT SESSION:  Progress Strengthening HEP - flex  bar AE/joint protection Scar massage Modalities as needed   Clarita LITTIE Pride, OT 03/28/2024, 12:35 PM  "

## 2024-03-28 NOTE — Patient Instructions (Signed)
 SABRA

## 2024-04-04 ENCOUNTER — Ambulatory Visit: Admitting: Occupational Therapy

## 2024-04-11 ENCOUNTER — Ambulatory Visit: Admitting: Occupational Therapy

## 2024-04-11 DIAGNOSIS — R278 Other lack of coordination: Secondary | ICD-10-CM

## 2024-04-11 DIAGNOSIS — M6281 Muscle weakness (generalized): Secondary | ICD-10-CM

## 2024-04-11 DIAGNOSIS — R208 Other disturbances of skin sensation: Secondary | ICD-10-CM

## 2024-04-11 DIAGNOSIS — R29898 Other symptoms and signs involving the musculoskeletal system: Secondary | ICD-10-CM

## 2024-04-11 NOTE — Patient Instructions (Signed)
 SABRA

## 2024-04-18 ENCOUNTER — Ambulatory Visit: Admitting: Occupational Therapy

## 2024-04-20 ENCOUNTER — Encounter: Admitting: Orthopedic Surgery

## 2024-04-25 ENCOUNTER — Ambulatory Visit: Admitting: Occupational Therapy
